# Patient Record
Sex: Female | Born: 2000 | Race: White | Hispanic: Yes | Marital: Single | Smoking: Never smoker
Health system: Southern US, Community
[De-identification: ages and names within clinical notes are randomized; demographics above are authoritative.]

## PROBLEM LIST (undated history)

## (undated) DIAGNOSIS — H539 Unspecified visual disturbance: Secondary | ICD-10-CM

## (undated) DIAGNOSIS — K760 Fatty (change of) liver, not elsewhere classified: Secondary | ICD-10-CM

## (undated) DIAGNOSIS — L853 Xerosis cutis: Secondary | ICD-10-CM

## (undated) DIAGNOSIS — G90A Postural orthostatic tachycardia syndrome (POTS): Secondary | ICD-10-CM

## (undated) DIAGNOSIS — F419 Anxiety disorder, unspecified: Secondary | ICD-10-CM

## (undated) DIAGNOSIS — F509 Eating disorder, unspecified: Secondary | ICD-10-CM

## (undated) DIAGNOSIS — Q796 Ehlers-Danlos syndrome, unspecified: Secondary | ICD-10-CM

## (undated) HISTORY — PX: TYMPANOSTOMY TUBE PLACEMENT: SHX32

## (undated) HISTORY — DX: Xerosis cutis: L85.3

## (undated) HISTORY — DX: Ehlers-Danlos syndrome, unspecified: Q79.60

## (undated) HISTORY — DX: Postural orthostatic tachycardia syndrome (POTS): G90.A

## (undated) HISTORY — PX: DENTAL SURGERY: SHX609

---
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
## 2022-01-02 ENCOUNTER — Other Ambulatory Visit: Payer: Self-pay | Admitting: *Deleted

## 2022-01-02 ENCOUNTER — Other Ambulatory Visit (HOSPITAL_COMMUNITY): Payer: Self-pay

## 2022-01-02 ENCOUNTER — Other Ambulatory Visit: Payer: Self-pay | Admitting: Pediatrics

## 2022-01-02 DIAGNOSIS — F411 Generalized anxiety disorder: Secondary | ICD-10-CM

## 2022-01-02 DIAGNOSIS — F902 Attention-deficit hyperactivity disorder, combined type: Secondary | ICD-10-CM

## 2022-01-02 DIAGNOSIS — F332 Major depressive disorder, recurrent severe without psychotic features: Secondary | ICD-10-CM

## 2022-01-02 DIAGNOSIS — G90A Postural orthostatic tachycardia syndrome (POTS): Secondary | ICD-10-CM

## 2022-01-02 DIAGNOSIS — G2581 Restless legs syndrome: Secondary | ICD-10-CM

## 2022-01-02 DIAGNOSIS — F5101 Primary insomnia: Secondary | ICD-10-CM

## 2022-01-02 DIAGNOSIS — R519 Headache, unspecified: Secondary | ICD-10-CM

## 2022-01-02 DIAGNOSIS — G8929 Other chronic pain: Secondary | ICD-10-CM

## 2022-01-02 DIAGNOSIS — G479 Sleep disorder, unspecified: Secondary | ICD-10-CM

## 2022-01-02 MED ORDER — FLUOXETINE HCL 40 MG PO CAPS: ORAL_CAPSULE | ORAL | 1 refills | Status: DC

## 2022-01-02 MED ORDER — HYDROXYZINE HCL 50 MG PO TABS: 50.0000 mg | ORAL_TABLET | Freq: Every day | ORAL | 1 refills | Status: DC

## 2022-01-02 MED ORDER — SODIUM CHLORIDE 1 G PO TABS: 1.0000 g | ORAL_TABLET | Freq: Three times a day (TID) | ORAL | 3 refills | Status: DC

## 2022-01-02 MED ORDER — METOPROLOL TARTRATE 25 MG PO TABS
12.5000 mg | ORAL_TABLET | Freq: Two times a day (BID) | ORAL | 3 refills | Status: DC
Start: 2022-01-02 — End: 2022-02-16

## 2022-01-02 MED ORDER — POLYSACCHARIDE-IRON COMPLEX 150 MG PO CAPS: 150.0000 mg | ORAL_CAPSULE | Freq: Every day | ORAL | 1 refills | Status: DC

## 2022-01-02 MED ORDER — MAGNESIUM OXIDE 400 MG PO CAPS: 400.0000 mg | ORAL_CAPSULE | Freq: Every day | ORAL | 1 refills | Status: DC

## 2022-01-02 MED ORDER — METHYLPHENIDATE HCL ER (CD) 40 MG PO CPCR: 40.0000 mg | ORAL_CAPSULE | ORAL | 0 refills | Status: DC

## 2022-01-02 MED ORDER — L-METHYLFOLATE-B6-B12 3-35-2 MG PO TABS: 1.0000 | ORAL_TABLET | Freq: Every day | ORAL | 3 refills | Status: DC

## 2022-01-02 MED ORDER — HYDROXYZINE HCL 10 MG PO TABS: 10.0000 mg | ORAL_TABLET | Freq: Three times a day (TID) | ORAL | 1 refills | Status: DC | PRN

## 2022-01-02 MED ORDER — LIDOCAINE 5 % EX PTCH: 1.0000 | MEDICATED_PATCH | CUTANEOUS | 0 refills | Status: DC

## 2022-01-03 ENCOUNTER — Ambulatory Visit: Payer: Medicaid Other | Admitting: Genetic Counselor

## 2022-01-04 ENCOUNTER — Other Ambulatory Visit: Payer: Self-pay | Admitting: Pediatrics

## 2022-01-04 ENCOUNTER — Other Ambulatory Visit (HOSPITAL_COMMUNITY): Payer: Self-pay

## 2022-01-04 MED ORDER — METHYLPHENIDATE HCL ER (LA) 40 MG PO CP24: 40.0000 mg | ORAL_CAPSULE | ORAL | 0 refills | Status: DC

## 2022-01-05 ENCOUNTER — Other Ambulatory Visit (HOSPITAL_COMMUNITY): Payer: Self-pay

## 2022-01-10 ENCOUNTER — Encounter: Payer: Medicaid Other | Admitting: Genetic Counselor

## 2022-01-23 NOTE — Progress Notes (Signed)
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   Personal Medical Information Alexandra [name] is an 21 year old Hispanic woman who reports that at age 88 her right knee popped out of its socket while she was sitting down. She was referred to an orthopedic who diagnosed her with loose joints. At age 34 she she began having of popping finger knuckles, shoulders and left hip. She was later found to have a bulging disc that contributed to severe back pain. Currently, in addition to the above issues, she reports lots of pain issues and a diagnosis of POTS. She tells me that she has not yet had cardiac imaging.  Family history Alexandra [name] is the only child of her parents. She is no longer in touch with her father, age 71 (II.2) and her other paternal relatives. Dad reportedly has mild hypermobility. No issues with hypermobility in other paternal or maternal relatives. Her mother is now 72 with hyperthyroidism and hypercholesterolemia.   Impression and Plan In summary, she reports history of joint laxity. I informed her that as there is no gene implicated in hEDS, genetic testing would not have any clinical utility. She understands this. Explained to her that she can get clinically evaluated to determine if she fulfills the criteria for hEDS. She verbalized understanding and seemed quite disappointed that genetic testing was not an option.   Sidney Ace, Ph.D, [facility] Clinical Molecular Geneticist

## 2022-02-02 ENCOUNTER — Other Ambulatory Visit: Payer: Self-pay | Admitting: Pediatrics

## 2022-02-02 DIAGNOSIS — K219 Gastro-esophageal reflux disease without esophagitis: Secondary | ICD-10-CM

## 2022-02-02 DIAGNOSIS — K581 Irritable bowel syndrome with constipation: Secondary | ICD-10-CM

## 2022-02-16 ENCOUNTER — Encounter: Payer: Self-pay | Admitting: Pediatrics

## 2022-02-16 ENCOUNTER — Ambulatory Visit (INDEPENDENT_AMBULATORY_CARE_PROVIDER_SITE_OTHER): Payer: Medicaid Other | Admitting: Pediatrics

## 2022-02-16 VITALS — BP 128/78 | HR 68 | Ht 63.0 in | Wt 240.6 lb

## 2022-02-16 DIAGNOSIS — Z3202 Encounter for pregnancy test, result negative: Secondary | ICD-10-CM | POA: Diagnosis not present

## 2022-02-16 DIAGNOSIS — Z113 Encounter for screening for infections with a predominantly sexual mode of transmission: Secondary | ICD-10-CM

## 2022-02-16 DIAGNOSIS — E782 Mixed hyperlipidemia: Secondary | ICD-10-CM | POA: Diagnosis not present

## 2022-02-16 DIAGNOSIS — R1084 Generalized abdominal pain: Secondary | ICD-10-CM

## 2022-02-16 DIAGNOSIS — R197 Diarrhea, unspecified: Secondary | ICD-10-CM

## 2022-02-16 DIAGNOSIS — G90A Postural orthostatic tachycardia syndrome (POTS): Secondary | ICD-10-CM

## 2022-02-16 DIAGNOSIS — K219 Gastro-esophageal reflux disease without esophagitis: Secondary | ICD-10-CM | POA: Diagnosis not present

## 2022-02-16 DIAGNOSIS — F411 Generalized anxiety disorder: Secondary | ICD-10-CM

## 2022-02-16 DIAGNOSIS — Z7251 High risk heterosexual behavior: Secondary | ICD-10-CM

## 2022-02-16 LAB — POCT URINE PREGNANCY: Preg Test, Ur: NEGATIVE

## 2022-02-16 MED ORDER — ULIPRISTAL ACETATE 30 MG PO TABS
30.0000 mg | ORAL_TABLET | Freq: Once | ORAL | Status: AC
  Administered 2022-02-16: 30 mg via ORAL

## 2022-02-16 NOTE — Patient Instructions (Signed)
Stop taking weight loss medication  Labs today  Plan B today  Use condoms always until 7 days after nexplanon insertion  We will bring you back next week for that

## 2022-02-16 NOTE — Progress Notes (Unsigned)
History was provided by the {relatives:[id number]}.  Alexandra [name] is a 21 y.o. female who is here for ***.  [facility]   HPI:  Pt reports she moved out of her mom's house about 2 weeks ago and is living with her boyfriend. She has been talking to him for a while before that and after she and mom got into an argument she left. He is in college and she is planning to go to college. Ever since she moved she hasn't taken her medications at all. She has been experiencing more nausea and abdominal pain. Mood has been better with move. Dizziness has been ok but not too bad. Fatigue some better.   Sexually active with female partner. Used condoms sometimes but stopped. Last had sex yesterday. She and her partner do not wish to be pregnant and she would like EC today and to start contraception. LMP was June 2-6. Would like to come back ASAP for nexplanon after Samson Frederic today. She does report that her boyfriend is on medications that make him unable to ejaculate.   Has been having belly pain that was particularly worse when she was stressed. She was taking lipozene weight loss medication which she only stopped yesterday. Incidentially, all . Did have nausea with some vomiting one night. Was getting some migraine on left side of head. Today feels better but has had some ongoing diarrhea that started 4-5 days ago. Started after upper GI sx. Denies fever. No known sick contacts.   No substance use. Boyfriend's dad does smoke around her sometimes. Boyfriend vapes but she doesn't.   No LMP recorded.  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 10/13/2021   Mixed hyperlipidemia 10/13/2021   PTSD (post-traumatic stress disorder) 09/21/2021   Transient alteration of awareness 09/21/2021   Sleep disturbance 08/10/2021   Low back pain 02/02/2021   POTS (postural orthostatic tachycardia syndrome) 01/20/2021   Bulging of lumbar  intervertebral disc 12/13/2020   Constipation 12/13/2020   Easy bruising 12/13/2020   Ehlers-Danlos syndrome 12/13/2020   Weight loss 11/16/2020   Chronic bilateral thoracic back pain 11/16/2020   Urinary incontinence without sensory awareness 11/16/2020   Hepatomegaly 06/16/2020   GAD (generalized anxiety disorder) 05/12/2020   Abnormal auditory perception of right ear 05/12/2020   Generalized abdominal pain 06/13/2018   Chronic fatigue 06/13/2018   Generalized hypermobility of joints 06/13/2018   Patellar subluxation 06/13/2018   Joint pain 06/13/2018   Self-injurious behavior 08/09/2017   Gastroesophageal reflux disease 08/09/2017   Attention deficit hyperactivity disorder (ADHD), combined type 08/09/2017   MDD (major depressive disorder), recurrent severe, without psychosis (HCC) 03/21/2017   Chronic nonintractable headache 01/22/2017   Insomnia 01/11/2017   Dizziness 01/04/2017   Suicidal ideation 12/04/2016   Anorexia nervosa with bulimia 11/13/2016    Current Outpatient Medications on File Prior to Visit  Medication Sig Dispense Refill   FLUoxetine (PROZAC) 40 MG capsule TAKE 1 CAPSULE(40 MG) BY MOUTH DAILY 90 capsule 1   hydrOXYzine (ATARAX) 10 MG tablet Take 1 tablet (10 mg total) by mouth 3 (three) times daily as needed. 180 tablet 1   hydrOXYzine (ATARAX) 50 MG tablet Take 1 tablet (50 mg total) by mouth at bedtime. 90 tablet 1   l-methylfolate-B6-B12 (METANX) 3-35-2 MG TABS tablet Take 1 tablet by mouth daily. 90 tablet 3   Magnesium Oxide 400 MG CAPS Take 1 capsule (400 mg total) by mouth at bedtime.  90 capsule 1   methylphenidate (RITALIN LA) 40 MG 24 hr capsule Take 1 capsule (40 mg total) by mouth every morning. 30 capsule 0   Polysaccharide-Iron Complex 150 MG CAPS Take 150 mg by mouth daily. 90 capsule 1   Prenatal Vit-Fe Fumarate-FA (PRENATAL VITAMIN PLUS LOW IRON) 27-1 MG TABS Take 1 tablet by mouth daily at 12 noon. 90 tablet 3   sodium chloride 1 g tablet  Take 1 tablet (1 g total) by mouth 3 (three) times daily. 90 tablet 3   topiramate (TOPAMAX) 25 MG tablet Take 1 tablet (25 mg total) by mouth at bedtime as needed. 30 tablet 3   topiramate (TOPAMAX) 25 MG tablet Take 1 tablet (25 mg total) by mouth at bedtime as needed. 30 tablet 3   Elastic Bandages & Supports (LUMBAR BACK BRACE/SUPPORT PAD) MISC 1 each by Does not apply route daily. SI joint belt (Patient not taking: Reported on 12/30/2021) 1 each 0   FLUoxetine (PROZAC) 20 MG capsule Take 20 mg and 40 mg capsule daily for daily total of 60 mg dose (Patient not taking: Reported on 02/16/2022) 90 capsule 1   lidocaine (LIDODERM) 5 % Place 1 patch onto the skin daily. Remove & Discard patch within 12 hours or as directed by MD (Patient not taking: Reported on 02/16/2022) 30 patch 0   metoprolol tartrate (LOPRESSOR) 25 MG tablet Take 0.5 tablets (12.5 mg total) by mouth 2 (two) times daily. (Patient not taking: Reported on 02/16/2022) 90 tablet 3   Misc. Devices (ADJUSTABLE ALUMINUM CANE) MISC Use 1 can as directed and instructed by physical therapist (Patient not taking: Reported on 12/30/2021) 1 each 0   No current facility-administered medications on file prior to visit.    Allergies  Allergen Reactions   Bee Pollen Other (See Comments)    "seasonal allergies"   Pollen Extract     "seasonal allergies"    Social History: Confidentiality was discussed with the patient and if applicable, with caregiver as well. Tobacco: *** Secondhand smoke exposure? {yes***/no:17258} Drugs/EtOH: *** Sexually active? {yes***/no:17258}  Safety: *** Last STI Screening:*** Pregnancy Prevention: ***  Physical Exam:    Vitals:   02/16/22 1506  BP: 128/78  Pulse: 68  Weight: 240 lb 9.6 oz (109.1 kg)  Height: 5\' 3"  (1.6 m)    Blood pressure %iles are not available for patients who are 18 years or older.  Physical Exam  Assessment/Plan: ***

## 2022-02-17 ENCOUNTER — Encounter: Payer: Self-pay | Admitting: Family

## 2022-02-17 LAB — COMPREHENSIVE METABOLIC PANEL
AG Ratio: 1.8 (calc) (ref 1.0–2.5)
ALT: 33 U/L — ABNORMAL HIGH (ref 5–32)
AST: 25 U/L (ref 12–32)
Albumin: 4.2 g/dL (ref 3.6–5.1)
Alkaline phosphatase (APISO): 60 U/L (ref 36–128)
BUN: 16 mg/dL (ref 7–20)
CO2: 24 mmol/L (ref 20–32)
Calcium: 9.4 mg/dL (ref 8.9–10.4)
Chloride: 105 mmol/L (ref 98–110)
Creat: 0.79 mg/dL (ref 0.50–0.96)
Globulin: 2.4 g/dL (calc) (ref 2.0–3.8)
Glucose, Bld: 88 mg/dL (ref 65–139)
Potassium: 4 mmol/L (ref 3.8–5.1)
Sodium: 139 mmol/L (ref 135–146)
Total Bilirubin: 0.5 mg/dL (ref 0.2–1.1)
Total Protein: 6.6 g/dL (ref 6.3–8.2)

## 2022-02-17 LAB — CBC WITH DIFFERENTIAL/PLATELET
Absolute Monocytes: 462 cells/uL (ref 200–900)
Basophils Absolute: 22 cells/uL (ref 0–200)
Basophils Relative: 0.4 %
Eosinophils Absolute: 160 cells/uL (ref 15–500)
Eosinophils Relative: 2.9 %
HCT: 37.4 % (ref 34.0–46.0)
Hemoglobin: 12 g/dL (ref 11.5–15.3)
Lymphs Abs: 2030 cells/uL (ref 1200–5200)
MCH: 25.8 pg (ref 25.0–35.0)
MCHC: 32.1 g/dL (ref 31.0–36.0)
MCV: 80.4 fL (ref 78.0–98.0)
MPV: 13 fL — ABNORMAL HIGH (ref 7.5–12.5)
Monocytes Relative: 8.4 %
Neutro Abs: 2827 cells/uL (ref 1800–8000)
Neutrophils Relative %: 51.4 %
Platelets: 224 10*3/uL (ref 140–400)
RBC: 4.65 10*6/uL (ref 3.80–5.10)
RDW: 13.4 % (ref 11.0–15.0)
Total Lymphocyte: 36.9 %
WBC: 5.5 10*3/uL (ref 4.5–13.0)

## 2022-02-17 LAB — LIPID PANEL
Cholesterol: 154 mg/dL (ref <170)
HDL: 35 mg/dL — ABNORMAL LOW (ref >45)
LDL Cholesterol (Calc): 89 mg/dL (calc) (ref <110)
Non-HDL Cholesterol (Calc): 119 mg/dL (calc) (ref <120)
Total CHOL/HDL Ratio: 4.4 (calc) (ref <5.0)
Triglycerides: 200 mg/dL — ABNORMAL HIGH (ref <90)

## 2022-02-17 LAB — LIPASE: Lipase: 40 U/L (ref 7–60)

## 2022-02-17 LAB — AMYLASE: Amylase: 33 U/L (ref 21–101)

## 2022-02-17 LAB — C. TRACHOMATIS/N. GONORRHOEAE RNA
C. trachomatis RNA, TMA: NOT DETECTED
N. gonorrhoeae RNA, TMA: NOT DETECTED

## 2022-02-17 LAB — TSH+FREE T4: TSH W/REFLEX TO FT4: 1.8 mIU/L

## 2022-02-21 ENCOUNTER — Encounter: Payer: Self-pay | Admitting: Family

## 2022-02-22 ENCOUNTER — Ambulatory Visit (INDEPENDENT_AMBULATORY_CARE_PROVIDER_SITE_OTHER): Payer: Medicaid Other | Admitting: Family

## 2022-02-22 ENCOUNTER — Encounter: Payer: Self-pay | Admitting: Family

## 2022-02-22 VITALS — BP 105/57 | HR 80 | Ht 63.39 in | Wt 240.2 lb

## 2022-02-22 DIAGNOSIS — Z3202 Encounter for pregnancy test, result negative: Secondary | ICD-10-CM | POA: Diagnosis not present

## 2022-02-22 DIAGNOSIS — Z30017 Encounter for initial prescription of implantable subdermal contraceptive: Secondary | ICD-10-CM | POA: Diagnosis not present

## 2022-02-22 LAB — POCT URINE PREGNANCY: Preg Test, Ur: NEGATIVE

## 2022-02-22 NOTE — Progress Notes (Signed)
History was provided by the patient.  Alexandra [name] is a 21 y.o. female who is here for nexplanon placement.   PCP confirmed? Yes.    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 10/13/2021   Mixed hyperlipidemia 10/13/2021   PTSD (post-traumatic stress disorder) 09/21/2021   Transient alteration of awareness 09/21/2021   Sleep disturbance 08/10/2021   Low back pain 02/02/2021   POTS (postural orthostatic tachycardia syndrome) 01/20/2021   Bulging of lumbar intervertebral disc 12/13/2020   Constipation 12/13/2020   Easy bruising 12/13/2020   Ehlers-Danlos syndrome 12/13/2020   Weight loss 11/16/2020   Chronic bilateral thoracic back pain 11/16/2020   Urinary incontinence without sensory awareness 11/16/2020   Hepatomegaly 06/16/2020   GAD (generalized anxiety disorder) 05/12/2020   Abnormal auditory perception of right ear 05/12/2020   Generalized abdominal pain 06/13/2018   Chronic fatigue 06/13/2018   Generalized hypermobility of joints 06/13/2018   Patellar subluxation 06/13/2018   Joint pain 06/13/2018   Self-injurious behavior 08/09/2017   Gastroesophageal reflux disease 08/09/2017   Attention deficit hyperactivity disorder (ADHD), combined type 08/09/2017   MDD (major depressive disorder), recurrent severe, without psychosis (HCC) 03/21/2017   Chronic nonintractable headache 01/22/2017   Insomnia 01/11/2017   Dizziness 01/04/2017   Suicidal ideation 12/04/2016   Anorexia nervosa with bulimia 11/13/2016    Current Outpatient Medications on File Prior to Visit  Medication Sig Dispense Refill   FLUoxetine (PROZAC) 40 MG capsule TAKE 1 CAPSULE(40 MG) BY MOUTH DAILY 90 capsule 1   hydrOXYzine (ATARAX) 10 MG tablet Take 1 tablet (10 mg total) by mouth 3 (three) times daily as needed. 180  tablet 1   hydrOXYzine (ATARAX) 50 MG tablet Take 1 tablet (50 mg total) by mouth at bedtime. 90 tablet 1   Magnesium Oxide 400 MG CAPS Take 1 capsule (400 mg total) by mouth at bedtime. 90 capsule 1   Polysaccharide-Iron Complex 150 MG CAPS Take 150 mg by mouth daily. 90 capsule 1   Prenatal Vit-Fe Fumarate-FA (PRENATAL VITAMIN PLUS LOW IRON) 27-1 MG TABS Take 1 tablet by mouth daily at 12 noon. 90 tablet 3   sodium chloride 1 g tablet Take 1 tablet (1 g total) by mouth 3 (three) times daily. 90 tablet 3   No current facility-administered medications on file prior to visit.    Allergies  Allergen Reactions   Bee Pollen Other (See Comments)    "seasonal allergies"   Pollen Extract     "seasonal allergies"    Physical Exam:    Vitals:   02/22/22 1120  BP: (!) 105/57  Pulse: 80  Weight: 240 lb 3.2 oz (109 kg)  Height: 5' 3.39" (1.61 m)    Blood pressure %iles are not available for patients who are 18 years or older. No LMP recorded.  Physical Exam Constitutional:      General: She is not in acute distress.    Appearance: She is well-developed.  HENT:     Head: Normocephalic and atraumatic.  Eyes:     General: No scleral icterus.    Pupils: Pupils are equal, round, and reactive to light.  Neck:     Thyroid: No thyromegaly.  Cardiovascular:     Rate and Rhythm: Normal rate and regular rhythm.     Heart sounds: Normal heart sounds.  No murmur heard. Pulmonary:     Effort: Pulmonary effort is normal.     Breath sounds: Normal breath sounds.  Abdominal:     Palpations: Abdomen is soft.  Musculoskeletal:        General: Normal range of motion.     Cervical back: Normal range of motion and neck supple.  Lymphadenopathy:     Cervical: No cervical adenopathy.  Skin:    General: Skin is warm and dry.     Findings: No rash.  Neurological:     Mental Status: She is alert and oriented to person, place, and time.     Cranial Nerves: No cranial nerve deficit.  Psychiatric:         Behavior: Behavior normal.        Thought Content: Thought content normal.        Judgment: Judgment normal.      Assessment/Plan: 1. Encounter for initial prescription of Nexplanon -see procedure note  -reviewed side effects, risks, benefits -abstain x 7 days before effective for birth control  -return in one month or sooner if needed  2. Pregnancy examination or test, negative result - POCT urine pregnancy

## 2022-02-22 NOTE — Patient Instructions (Signed)
Follow-up  in 1 month. Schedule this appointment before you leave clinic today.  Congratulations on getting your Nexplanon placement!  Below is some important information about Nexplanon.  First remember that Nexplanon does not prevent sexually transmitted infections.  Condoms will help prevent sexually transmitted infections. The Nexplanon starts working 7 days after it was inserted.  There is a risk of getting pregnant if you have unprotected sex in those first 7 days after placement of the Nexplanon.  The Nexplanon lasts for 3 years but can be removed at any time.  You can become pregnant as early as 1 week after removal.  You can have a new Nexplanon put in after the old one is removed if you like.  It is not known whether Nexplanon is as effective in women who are very overweight because the studies did not include many overweight women.  Nexplanon interacts with some medications, including barbiturates, bosentan, carbamazepine, felbamate, griseofulvin, oxcarbazepine, phenytoin, rifampin, St. John's wort, topiramate, HIV medicines.  Please alert your doctor if you are on any of these medicines.  Always tell other healthcare providers that you have a Nexplanon in your arm.  The Nexplanon was placed just under the skin.  Leave the outside bandage on for 24 hours.  Leave the smaller bandage on for 3-5 days or until it falls off on its own.  Keep the area clean and dry for 3-5 days. There is usually bruising or swelling at the insertion site for a few days to a week after placement.  If you see redness or pus draining from the insertion site, call us immediately.  Keep your user card with the date the implant was placed and the date the implant is to be removed.  The most common side effect is a change in your menstrual bleeding pattern.   This bleeding is generally not harmful to you but can be annoying.  Call or come in to see us if you have any concerns about the bleeding or if you have any  side effects or questions.    We will call you in 1 week to check in and we would like you to return to the clinic for a follow-up visit in 1 month.  You can call [facility] 24 hours a day with any questions or concerns.  There is always a nurse or doctor available to take your call.  Call 9-1-1 if you have a life-threatening emergency.  For anything else, please call us at [phone] before heading to the ER. 

## 2022-02-23 ENCOUNTER — Encounter: Payer: Self-pay | Admitting: Family

## 2022-02-26 NOTE — Progress Notes (Signed)
Cardiology Office Note:    Date:  03/10/2022   ID:  Alexandra [name], DOB 02-01-2001, MRN [id number]  PCP:  [facility]   [facility] Providers Cardiologist:  Meriam Sprague, MD {   Referring MD: [facility]     History of Present Illness:    Alexandra [name] is a 21 y.o. female with a hx of anxiety, depression and POTS who presents to clinic for follow-up.  She was initially seen in 08/2021 for evaluation for POTs. Orthostatics revealed postural tachycardia without drop in blood pressure. She was started on metop 12.5mg  BID.  Was seen by Eligha Bridegroom where she reported that she felt better on the metop but had difficulty getting her medications due to transportation issues. She was also having atypical chest pain at that time.  Today, the patient overall feels okay today. Continues to have palpitations especially with activity. Occasional lightheadedness which occurs with the rapid heart rates. HR seem to improve with walking or trying to exercise. Metoprolol helped the heart rates but she did have one episode of nausea/vomiting and lightheadedness and she since stopped. She is open to trying a different beta blocker.  Past Medical History:  Diagnosis Date   Anxiety    Dry skin    Eating disorder    Vision abnormalities     Past Surgical History:  Procedure Laterality Date   DENTAL SURGERY     TYMPANOSTOMY TUBE PLACEMENT      Current Medications: Current Meds  Medication Sig   hydrOXYzine (ATARAX) 10 MG tablet Take 1 tablet (10 mg total) by mouth 3 (three) times daily as needed.   hydrOXYzine (ATARAX) 50 MG tablet Take 1 tablet (50 mg total) by mouth at bedtime.   Prenatal Vit-Fe Fumarate-FA (PRENATAL VITAMIN PLUS LOW IRON) 27-1 MG TABS Take 1 tablet by mouth daily at 12 noon.   propranolol (INDERAL) 10 MG tablet Take 1 tablet (10 mg total) by mouth 2 (two) times daily.     Allergies:   Bee pollen and  Pollen extract   Social History   Socioeconomic History   Marital status: Single    Spouse name: Not on file   Number of children: Not on file   Years of education: Not on file   Highest education level: Not on file  Occupational History   Not on file  Tobacco Use   Smoking status: Never    Passive exposure: Yes   Smokeless tobacco: Never   Tobacco comments:    family smokes outside  Vaping Use   Vaping Use: Never used  Substance and Sexual Activity   Alcohol use: No   Drug use: No   Sexual activity: Never    Birth control/protection: Abstinence, Pill    Comment: pt. takes birth control pills for cycle regulation  Other Topics Concern   Not on file  Social History Narrative   [employer]th [employer] 22-23 school - lives with brother, sister, step father and mother.    Social Determinants of Health   Financial Resource Strain: Medium Risk (12/30/2021)   Overall Financial Resource Strain (CARDIA)    Difficulty of Paying Living Expenses: Somewhat hard  Food Insecurity: No Food Insecurity (12/30/2021)   Hunger Vital Sign    Worried About Running Out of Food in the Last Year: Never true    Ran Out of Food in the Last Year: Never true  Transportation Needs: Unmet Transportation Needs (03/06/2022)   PRAPARE -  Administrator, Civil Service (Medical): Yes    Lack of Transportation (Non-Medical): Yes  Physical Activity: Not on file  Stress: Not on file  Social Connections: Not on file     Family History: The patient's family history includes Asthma in her father; Cancer in her brother; Cataracts in her sister; Hodgkin's lymphoma in her brother; Strabismus in her sister.  ROS:   Please see the history of present illness.    Review of Systems  Constitutional:  Positive for malaise/fatigue.  Respiratory:  Positive for shortness of breath.   Cardiovascular:  Positive for chest pain and palpitations. Negative for orthopnea, claudication, leg swelling and PND.   Gastrointestinal:  Positive for abdominal pain.  Genitourinary:  Negative for hematuria.  Musculoskeletal:  Negative for falls.  Neurological:  Positive for dizziness. Negative for loss of consciousness.  Psychiatric/Behavioral:  Positive for depression. The patient is nervous/anxious.      EKGs/Labs/Other Studies Reviewed:    The following studies were reviewed today: No cardiac studies  EKG:  ECG today personally reviewed with NSR, HR 67  Recent Labs: 02/16/2022: ALT 33; BUN 16; Creat 0.79; Hemoglobin 12.0; Platelets 224; Potassium 4.0; Sodium 139  Recent Lipid Panel    Component Value Date/Time   CHOL 154 02/16/2022 1701   TRIG 200 (H) 02/16/2022 1701   HDL 35 (L) 02/16/2022 1701   CHOLHDL 4.4 02/16/2022 1701   VLDL 17 12/06/2017 0723   LDLCALC 89 02/16/2022 1701          Physical Exam:    VS:  BP 104/70   Pulse 67   Ht 5\' 3"  (1.6 m)   Wt 233 lb 3.2 oz (105.8 kg)   SpO2 98%   BMI 41.31 kg/m     Wt Readings from Last 3 Encounters:  03/10/22 233 lb 3.2 oz (105.8 kg) (>99 %, Z= 2.33)*  03/06/22 242 lb 9.6 oz (110 kg) (>99 %, Z= 2.41)*  02/22/22 240 lb 3.2 oz (109 kg) (>99 %, Z= 2.39)*   * Growth percentiles are based on CDC (Girls, 2-20 Years) data.     GEN:  Well nourished, well developed in no acute distress HEENT: Normal NECK: No JVD; No carotid bruits CARDIAC: RRR, no murmurs RESPIRATORY:  Clear to auscultation without rales, wheezing or rhonchi  ABDOMEN: Soft, non-tender, non-distended MUSCULOSKELETAL:  No edema; No deformity  SKIN: Warm and dry NEUROLOGIC:  Alert and oriented x 3 PSYCHIATRIC:  Normal affect   ASSESSMENT:    1. POTS (postural orthostatic tachycardia syndrome)   2. Ehlers-Danlos syndrome   3. 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 Deemed not to be a  candidate for genetic testing due to concern for hypermobile EDS. Did not tolerate metoprolol due to nausea but it did help with HR. Will trial propranolol and see if she tolerates.  -Did not tolerate metoprolol -Will do a low dose trial of propranolol 10mg  BID -Continue hydration -Continue salt tabs vs gatorade zero or poweraide zero -Compression socks -Graduated exercise  #Anxiety: #Depression: -Management per PCP      Medication Adjustments/Labs and Tests Ordered: Current medicines are reviewed at length with the patient today.  Concerns regarding medicines are outlined above.  Orders Placed This Encounter  Procedures   EKG 12-Lead   Meds ordered this encounter  Medications   propranolol (INDERAL)  10 MG tablet    Sig: Take 1 tablet (10 mg total) by mouth 2 (two) times daily.    Dispense:  60 tablet    Refill:  6    Patient Instructions  Medication Instructions:  Please discontinue Metoprolol. Take Propranolol 10 mg twice a day. Continue all other medications as listed.  *If you need a refill on your cardiac medications before your next appointment, please call your pharmacy*  Follow-Up: At [facility], you and your health needs are our priority.  As part of our continuing mission to provide you with exceptional heart care, we have created designated Provider Care Teams.  These Care Teams include your primary Cardiologist (physician) and Advanced Practice Providers (APPs -  Physician Assistants and Nurse Practitioners) who all work together to provide you with the care you need, when you need it.  We recommend signing up for the patient portal called "MyChart".  Sign up information is provided on this After Visit Summary.  MyChart is used to connect with patients for Virtual Visits (Telemedicine).  Patients are able to view lab/test results, encounter notes, upcoming appointments, etc.  Non-urgent messages can be sent to your provider as well.   To learn more about what  you can do with MyChart, go to [email or web address].    Your next appointment:   3 month(s)  The format for your next appointment:   In Person  Provider:   Eligha Bridegroom, NP     {   Important Information About Sugar          Signed, Meriam Sprague, MD  03/10/2022 12:54 PM    [facility]

## 2022-02-27 ENCOUNTER — Encounter: Payer: Self-pay | Admitting: Pediatrics

## 2022-02-28 ENCOUNTER — Encounter: Payer: Self-pay | Admitting: Family

## 2022-02-28 ENCOUNTER — Other Ambulatory Visit: Payer: Self-pay | Admitting: Family

## 2022-03-01 ENCOUNTER — Other Ambulatory Visit: Payer: Self-pay | Admitting: Family

## 2022-03-01 ENCOUNTER — Encounter: Payer: Self-pay | Admitting: Family

## 2022-03-01 ENCOUNTER — Other Ambulatory Visit: Payer: Self-pay | Admitting: Pediatrics

## 2022-03-01 DIAGNOSIS — Z30017 Encounter for initial prescription of implantable subdermal contraceptive: Secondary | ICD-10-CM | POA: Diagnosis not present

## 2022-03-01 MED ORDER — ETONOGESTREL 68 MG ~~LOC~~ IMPL
68.0000 mg | DRUG_IMPLANT | Freq: Once | SUBCUTANEOUS | Status: AC
  Administered 2022-03-01: 68 mg via SUBCUTANEOUS

## 2022-03-01 MED ORDER — FLUCONAZOLE 150 MG PO TABS: ORAL_TABLET | ORAL | 0 refills | Status: DC

## 2022-03-01 NOTE — Procedures (Signed)
Nexplanon Insertion  No contraindications for placement.  No liver disease, no unexplained vaginal bleeding, no h/o breast cancer, no h/o blood clots.  No LMP recorded.  UHCG: negative  Last Unprotected sex: a few days ago  Risks & benefits of Nexplanon discussed The nexplanon device was purchased and supplied by [facility]. Packaging instructions supplied to patient Consent form signed  The patient denies any allergies to anesthetics or antiseptics.  Procedure: Pt was placed in supine position. The left arm was flexed at the elbow and externally rotated so that left wrist was parallel to left ear The medial epicondyle of the left arm was identified The insertions site was marked 8 cm proximal to the medial epicondyle The insertion site was cleaned with Betadine The area surrounding the insertion site was covered with a sterile drape 1% lidocaine was injected just under the skin at the insertion site extending 4 cm proximally. The sterile preloaded disposable Nexaplanon applicator was removed from the sterile packaging The applicator needle was inserted at a 30 degree angle at 8 cm proximal to the medial epicondyle as marked The applicator was lowered to a horizontal position and advanced just under the skin for the full length of the needle The slider on the applicator was retracted fully while the applicator remained in the same position, then the applicator was removed. The implant was confirmed via palpation as being in position The implant position was demonstrated to the patient Pressure dressing was applied to the patient.  The patient was instructed to removed the pressure dressing in 24 hrs.  The patient was advised to move slowly from a supine to an upright position  The patient denied any concerns or complaints  The patient was instructed to schedule a follow-up appt in 1 month and to call sooner if any concerns.  The patient acknowledged agreement and understanding of  the plan.

## 2022-03-02 ENCOUNTER — Other Ambulatory Visit: Payer: Self-pay | Admitting: Family

## 2022-03-02 DIAGNOSIS — Z7187 Encounter for pediatric-to-adult transition counseling: Secondary | ICD-10-CM

## 2022-03-05 ENCOUNTER — Encounter: Payer: Self-pay | Admitting: Family

## 2022-03-06 ENCOUNTER — Ambulatory Visit (INDEPENDENT_AMBULATORY_CARE_PROVIDER_SITE_OTHER): Payer: Medicaid Other | Admitting: Pediatrics

## 2022-03-06 ENCOUNTER — Other Ambulatory Visit: Payer: Self-pay

## 2022-03-06 ENCOUNTER — Encounter: Payer: Self-pay | Admitting: Pediatrics

## 2022-03-06 VITALS — BP 114/68 | HR 74 | Ht 63.0 in | Wt 242.6 lb

## 2022-03-06 DIAGNOSIS — G90A Postural orthostatic tachycardia syndrome (POTS): Secondary | ICD-10-CM | POA: Diagnosis not present

## 2022-03-06 DIAGNOSIS — R519 Headache, unspecified: Secondary | ICD-10-CM

## 2022-03-06 DIAGNOSIS — B3731 Acute candidiasis of vulva and vagina: Secondary | ICD-10-CM

## 2022-03-06 DIAGNOSIS — G8929 Other chronic pain: Secondary | ICD-10-CM

## 2022-03-06 DIAGNOSIS — Z975 Presence of (intrauterine) contraceptive device: Secondary | ICD-10-CM | POA: Diagnosis not present

## 2022-03-06 MED ORDER — FLUCONAZOLE 150 MG PO TABS
ORAL_TABLET | ORAL | 0 refills | Status: DC
  Filled 2022-03-06: qty 2, 4d supply, fill #0

## 2022-03-06 NOTE — Patient Instructions (Signed)
Adult Primary Care Clinics Name Criteria Services   [facility]  Address: [address] [place], [place] [address]  Phone: [phone] Hours: Monday - Friday 9 AM -6 PM  Types of insurance accepted:  Commercial insurance [facility] (orange card) Medicaid Medicare Uninsured  Language services:  Video and phone interpreters available   Ages 18 and older    Adult primary care Onsite pharmacy Integrated behavioral health Financial assistance counseling Walk-in hours for established patients  Financial assistance counseling hours: Tuesdays 2:00PM - 5:00PM  Thursday 8:30AM - 4:30PM  Space is limited, 10 on Tuesday and 20 on Thursday. It's on first come first serve basis  Name Criteria Services   [facility]  Address: [address] [place], [place] [address]  Phone: [phone]  Hours: Monday - Friday 8:30 AM - 5 PM  Types of insurance accepted:  Commercial insurance Medicaid Medicare Uninsured  Language services:  Video and phone interpreters available   All ages - newborn to adult   Primary care for all ages (children and adults) Integrated behavioral health Nutritionist Financial assistance counseling   Name Criteria Services   [facility]  Located on the ground floor of [facility]  Address: [address]  [place],  [place]  [address]  Phone: [phone]  Hours: Monday - Friday 8:15 AM - 5 PM  Types of insurance accepted:  Commercial insurance Medicaid Medicare Uninsured  Language services:  Video and phone interpreters available   Ages 18 and older   Adult primary care Nutritionist Certified Diabetes Educator  Integrated behavioral health Financial assistance counseling   Name Criteria Services   [facility]  Address: [address] [place], [place] [address]  Phone:  [phone]  Hours: Monday - Friday 8:30 AM - 5 PM    Types of insurance accepted:  Commercial insurance Medicaid Medicare Uninsured  Language services:  Video and phone interpreters available   All ages - newborn to adult   Primary care for all ages (children and adults) Integrated behavioral health Financial assistance counseling    

## 2022-03-06 NOTE — Progress Notes (Signed)
History was provided by the patient.  Alexandra [name] is a 21 y.o. female who is here for depression, anxiety, multiple medical concerns, nexplanon f/u.  [facility]   HPI:  Pt reports she never got medication for yeast because it wasn't delivered and she had called but it didn't  come.   She is still getting bad migraines which cause her a lot of problems. Happening a few times a week. Tylenol seems to help but doesn't always go away, but does tend to stop on it's own after that.   Denies additional vomiting.   Otherwise has not restarted any of her other medications.   She is concerned how much she has been gaining weight. She has been eating more since going to her boyfriend's house. Usually eats at least 3 times a day. Drinking water, juice, sometimes soda.   No LMP recorded.  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 10/13/2021   Mixed hyperlipidemia 10/13/2021   PTSD (post-traumatic stress disorder) 09/21/2021   Transient alteration of awareness 09/21/2021   Sleep disturbance 08/10/2021   Low back pain 02/02/2021   POTS (postural orthostatic tachycardia syndrome) 01/20/2021   Bulging of lumbar intervertebral disc 12/13/2020   Constipation 12/13/2020   Easy bruising 12/13/2020   Ehlers-Danlos syndrome 12/13/2020   Weight loss 11/16/2020   Chronic bilateral thoracic back pain 11/16/2020   Urinary incontinence without sensory awareness 11/16/2020   Hepatomegaly 06/16/2020   GAD (generalized anxiety disorder) 05/12/2020   Abnormal auditory perception of right ear 05/12/2020   Generalized abdominal pain 06/13/2018   Chronic fatigue 06/13/2018   Generalized hypermobility of joints 06/13/2018   Patellar subluxation 06/13/2018   Joint pain 06/13/2018   Self-injurious behavior 08/09/2017   Gastroesophageal reflux disease 08/09/2017   Attention deficit hyperactivity disorder (ADHD), combined  type 08/09/2017   MDD (major depressive disorder), recurrent severe, without psychosis (HCC) 03/21/2017   Chronic nonintractable headache 01/22/2017   Insomnia 01/11/2017   Dizziness 01/04/2017   Suicidal ideation 12/04/2016   Anorexia nervosa with bulimia 11/13/2016    Current Outpatient Medications on File Prior to Visit  Medication Sig Dispense Refill   FLUoxetine (PROZAC) 40 MG capsule TAKE 1 CAPSULE(40 MG) BY MOUTH DAILY 90 capsule 1   hydrOXYzine (ATARAX) 10 MG tablet Take 1 tablet (10 mg total) by mouth 3 (three) times daily as needed. 180 tablet 1   hydrOXYzine (ATARAX) 50 MG tablet Take 1 tablet (50 mg total) by mouth at bedtime. 90 tablet 1   Magnesium Oxide 400 MG CAPS Take 1 capsule (400 mg total) by mouth at bedtime. 90 capsule 1   Polysaccharide-Iron Complex 150 MG CAPS Take 150 mg by mouth daily. 90 capsule 1   Prenatal Vit-Fe Fumarate-FA (PRENATAL VITAMIN PLUS LOW IRON) 27-1 MG TABS Take 1 tablet by mouth daily at 12 noon. 90 tablet 3   sodium chloride 1 g tablet Take 1 tablet (1 g total) by mouth 3 (three) times daily. 90 tablet 3   No current facility-administered medications on file prior to visit.    Allergies  Allergen Reactions   Bee Pollen Other (See Comments)    "seasonal allergies"   Pollen Extract     "seasonal allergies"    Physical Exam:    Vitals:   03/06/22 1118  BP: 114/68  Pulse: 74  Weight: 242 lb 9.6 oz (110 kg)  Height: 5\' 3"  (1.6 m)    Blood  pressure %iles are not available for patients who are 18 years or older.  Physical Exam Vitals and nursing note reviewed.  Constitutional:      General: She is not in acute distress.    Appearance: She is well-developed.  Neck:     Thyroid: No thyromegaly.  Cardiovascular:     Rate and Rhythm: Normal rate and regular rhythm.     Heart sounds: No murmur heard. Pulmonary:     Breath sounds: Normal breath sounds.  Abdominal:     Palpations: Abdomen is soft. There is no mass.     Tenderness:  There is no abdominal tenderness. There is no guarding.  Musculoskeletal:     Right lower leg: No edema.     Left lower leg: No edema.  Lymphadenopathy:     Cervical: No cervical adenopathy.  Skin:    General: Skin is warm.     Findings: No rash.     Comments: Nexplanon well healed in LUE  Neurological:     Mental Status: She is alert.     Comments: No tremor     Assessment/Plan: 1. POTS (postural orthostatic tachycardia syndrome) Established with cardiology. Not currently taking meds as prescribed. Has f/u scheduled.   2. Nexplanon in place Well healed today without concerns.   3. Vaginal yeast infection Will send diflucan downstairs to pharmacy for pick up today.  - fluconazole (DIFLUCAN) 150 MG tablet; Take 1 tablet today and 1 tablet 3 days from now  Dispense: 2 tablet; Refill: 0  4. Chronic nonintractable headache, unspecified headache type Discussed getting established with adult provider for multiple ongoing medical concerns. All medicaid providers list given and instructed her to call for appt. She was agreeable.   Return as needed prior to establishing with adult care.   Alfonso Ramus, FNP

## 2022-03-10 ENCOUNTER — Encounter: Payer: Self-pay | Admitting: Cardiology

## 2022-03-10 ENCOUNTER — Ambulatory Visit (INDEPENDENT_AMBULATORY_CARE_PROVIDER_SITE_OTHER): Payer: Medicaid Other | Admitting: Cardiology

## 2022-03-10 VITALS — BP 104/70 | HR 67 | Ht 63.0 in | Wt 233.2 lb

## 2022-03-10 DIAGNOSIS — F419 Anxiety disorder, unspecified: Secondary | ICD-10-CM | POA: Diagnosis not present

## 2022-03-10 DIAGNOSIS — G90A Postural orthostatic tachycardia syndrome (POTS): Secondary | ICD-10-CM

## 2022-03-10 DIAGNOSIS — Q796 Ehlers-Danlos syndrome, unspecified: Secondary | ICD-10-CM | POA: Diagnosis not present

## 2022-03-10 MED ORDER — PROPRANOLOL HCL 10 MG PO TABS
10.0000 mg | ORAL_TABLET | Freq: Two times a day (BID) | ORAL | 6 refills | Status: DC
Start: 2022-03-10 — End: 2022-05-17

## 2022-03-10 NOTE — Patient Instructions (Signed)
Medication Instructions:  Please discontinue Metoprolol. Take Propranolol 10 mg twice a day. Continue all other medications as listed.  *If you need a refill on your cardiac medications before your next appointment, please call your pharmacy*  Follow-Up: At [facility], you and your health needs are our priority.  As part of our continuing mission to provide you with exceptional heart care, we have created designated Provider Care Teams.  These Care Teams include your primary Cardiologist (physician) and Advanced Practice Providers (APPs -  Physician Assistants and Nurse Practitioners) who all work together to provide you with the care you need, when you need it.  We recommend signing up for the patient portal called "MyChart".  Sign up information is provided on this After Visit Summary.  MyChart is used to connect with patients for Virtual Visits (Telemedicine).  Patients are able to view lab/test results, encounter notes, upcoming appointments, etc.  Non-urgent messages can be sent to your provider as well.   To learn more about what you can do with MyChart, go to [email or web address].    Your next appointment:   3 month(s)  The format for your next appointment:   In Person  Provider:   Eligha Bridegroom, NP     {   Important Information About Sugar

## 2022-03-20 ENCOUNTER — Encounter: Payer: Self-pay | Admitting: Family

## 2022-03-24 ENCOUNTER — Encounter: Payer: Self-pay | Admitting: Family

## 2022-03-30 ENCOUNTER — Encounter: Payer: Self-pay | Admitting: Family

## 2022-04-03 ENCOUNTER — Encounter: Payer: Self-pay | Admitting: Family

## 2022-04-03 ENCOUNTER — Other Ambulatory Visit: Payer: Self-pay | Admitting: Pediatrics

## 2022-04-03 MED ORDER — ONDANSETRON 8 MG PO TBDP: 8.0000 mg | ORAL_TABLET | Freq: Three times a day (TID) | ORAL | 0 refills | Status: DC | PRN

## 2022-04-10 ENCOUNTER — Encounter: Payer: Medicaid Other | Admitting: Physical Medicine and Rehabilitation

## 2022-04-14 ENCOUNTER — Encounter (HOSPITAL_COMMUNITY): Payer: Self-pay

## 2022-04-14 ENCOUNTER — Emergency Department (HOSPITAL_COMMUNITY): Payer: Medicaid Other

## 2022-04-14 ENCOUNTER — Emergency Department (HOSPITAL_COMMUNITY)
Admission: EM | Admit: 2022-04-14 | Discharge: 2022-04-14 | Disposition: A | Discharge status: Home / Self Care | Payer: Medicaid Other | Attending: Emergency Medicine | Admitting: Emergency Medicine

## 2022-04-14 ENCOUNTER — Encounter: Payer: Self-pay | Admitting: Family

## 2022-04-14 ENCOUNTER — Other Ambulatory Visit: Payer: Self-pay

## 2022-04-14 ENCOUNTER — Ambulatory Visit
Admission: EM | Admit: 2022-04-14 | Discharge: 2022-04-14 | Disposition: A | Discharge status: Home / Self Care | Payer: Medicaid Other | Attending: Internal Medicine | Admitting: Internal Medicine

## 2022-04-14 DIAGNOSIS — R109 Unspecified abdominal pain: Secondary | ICD-10-CM | POA: Insufficient documentation

## 2022-04-14 DIAGNOSIS — R112 Nausea with vomiting, unspecified: Secondary | ICD-10-CM | POA: Insufficient documentation

## 2022-04-14 DIAGNOSIS — R1084 Generalized abdominal pain: Secondary | ICD-10-CM

## 2022-04-14 DIAGNOSIS — R197 Diarrhea, unspecified: Secondary | ICD-10-CM

## 2022-04-14 DIAGNOSIS — R42 Dizziness and giddiness: Secondary | ICD-10-CM

## 2022-04-14 DIAGNOSIS — I880 Nonspecific mesenteric lymphadenitis: Secondary | ICD-10-CM

## 2022-04-14 DIAGNOSIS — R110 Nausea: Secondary | ICD-10-CM

## 2022-04-14 HISTORY — DX: Fatty (change of) liver, not elsewhere classified: K76.0

## 2022-04-14 LAB — CBC
HCT: 42 % (ref 36.0–46.0)
Hemoglobin: 13.6 g/dL (ref 12.0–15.0)
MCH: 25.6 pg — ABNORMAL LOW (ref 26.0–34.0)
MCHC: 32.4 g/dL (ref 30.0–36.0)
MCV: 78.9 fL — ABNORMAL LOW (ref 80.0–100.0)
Platelets: 194 10*3/uL (ref 150–400)
RBC: 5.32 MIL/uL — ABNORMAL HIGH (ref 3.87–5.11)
RDW: 13.8 % (ref 11.5–15.5)
WBC: 4.4 10*3/uL (ref 4.0–10.5)
nRBC: 0 % (ref 0.0–0.2)

## 2022-04-14 LAB — URINALYSIS, ROUTINE W REFLEX MICROSCOPIC
Bacteria, UA: NONE SEEN
Bilirubin Urine: NEGATIVE
Glucose, UA: NEGATIVE mg/dL
Ketones, ur: NEGATIVE mg/dL
Nitrite: NEGATIVE
Protein, ur: NEGATIVE mg/dL
Specific Gravity, Urine: 1.015 (ref 1.005–1.030)
pH: 6 (ref 5.0–8.0)

## 2022-04-14 LAB — COMPREHENSIVE METABOLIC PANEL
ALT: 41 U/L (ref 0–44)
AST: 29 U/L (ref 15–41)
Albumin: 4.1 g/dL (ref 3.5–5.0)
Alkaline Phosphatase: 66 U/L (ref 38–126)
Anion gap: 5 (ref 5–15)
BUN: 9 mg/dL (ref 6–20)
CO2: 20 mmol/L — ABNORMAL LOW (ref 22–32)
Calcium: 9.3 mg/dL (ref 8.9–10.3)
Chloride: 112 mmol/L — ABNORMAL HIGH (ref 98–111)
Creatinine, Ser: 0.87 mg/dL (ref 0.44–1.00)
GFR, Estimated: >60 mL/min (ref >60)
Glucose, Bld: 101 mg/dL — ABNORMAL HIGH (ref 70–99)
Potassium: 4.1 mmol/L (ref 3.5–5.1)
Sodium: 137 mmol/L (ref 135–145)
Total Bilirubin: 0.6 mg/dL (ref 0.3–1.2)
Total Protein: 7.3 g/dL (ref 6.5–8.1)

## 2022-04-14 LAB — RAPID URINE DRUG SCREEN, HOSP PERFORMED
Amphetamines: NOT DETECTED
Barbiturates: NOT DETECTED
Benzodiazepines: NOT DETECTED
Cocaine: NOT DETECTED
Opiates: NOT DETECTED
Tetrahydrocannabinol: NOT DETECTED

## 2022-04-14 LAB — I-STAT BETA HCG BLOOD, ED (MC, WL, AP ONLY): I-stat hCG, quantitative: <5 m[IU]/mL (ref <5)

## 2022-04-14 LAB — LIPASE, BLOOD: Lipase: 46 U/L (ref 11–51)

## 2022-04-14 LAB — POCT URINE PREGNANCY: Preg Test, Ur: NEGATIVE

## 2022-04-14 MED ORDER — IOHEXOL 300 MG/ML  SOLN
100.0000 mL | Freq: Once | INTRAMUSCULAR | Status: AC | PRN
  Administered 2022-04-14: 100 mL via INTRAVENOUS

## 2022-04-14 MED ORDER — ONDANSETRON 4 MG PO TBDP
4.0000 mg | ORAL_TABLET | Freq: Once | ORAL | Status: AC | PRN
  Administered 2022-04-14: 4 mg via ORAL
  Filled 2022-04-14: qty 1

## 2022-04-14 MED ORDER — ONDANSETRON HCL 4 MG/2ML IJ SOLN
4.0000 mg | Freq: Once | INTRAMUSCULAR | Status: AC
  Administered 2022-04-14: 4 mg via INTRAVENOUS
  Filled 2022-04-14: qty 2

## 2022-04-14 MED ORDER — TRAMADOL HCL 50 MG PO TABS: 50.0000 mg | ORAL_TABLET | Freq: Four times a day (QID) | ORAL | 0 refills | Status: DC | PRN

## 2022-04-14 MED ORDER — ONDANSETRON 8 MG PO TBDP: 8.0000 mg | ORAL_TABLET | Freq: Three times a day (TID) | ORAL | 0 refills | Status: DC | PRN

## 2022-04-14 MED ORDER — SODIUM CHLORIDE 0.9 % IV BOLUS
1000.0000 mL | Freq: Once | INTRAVENOUS | Status: AC
  Administered 2022-04-14: 1000 mL via INTRAVENOUS

## 2022-04-14 MED ORDER — HYDROMORPHONE HCL 1 MG/ML IJ SOLN
0.5000 mg | Freq: Once | INTRAMUSCULAR | Status: AC
  Administered 2022-04-14: 0.5 mg via INTRAVENOUS
  Filled 2022-04-14: qty 1

## 2022-04-14 NOTE — Discharge Instructions (Signed)
Pregnancy test was negative.  Please go to the emergency department as soon as you leave urgent care for further evaluation and management. 

## 2022-04-14 NOTE — ED Triage Notes (Signed)
Pt c/o nausea, abd pain, onset ~2-3 weeks

## 2022-04-14 NOTE — ED Provider Notes (Signed)
[facility]    CSN: [id number] Arrival date & time: 04/14/22  1228      History   Chief Complaint Chief Complaint  Patient presents with   Abdominal Pain    HPI Alexandra [name] is a 21 y.o. female.   Patient presents with intermittent and persistent abdominal pain that has been present for 2 to 3 weeks.  Patient rates pain at 6/10 on pain scale and describes it as a cramping/stabbing pain.  Pain is located mainly in the lower abdomen at right lower quadrant but is sometimes located under bilateral rib cage.  Also having nausea, vomiting, diarrhea.  Denies blood in stool or emesis.  Patient does report history of GERD and IBS but reports that these symptoms "feel different".  Patient is not sure how many times she has vomited or had diarrhea on a daily basis since symptoms started.  Denies any associated urinary symptoms, back pain, fever.  Denies any known sick contacts.  Patient reports that she has been drinking lots of fluids.  She does report some dizziness as well.  Patient states that last menstrual cycle completed a few days prior, but she reports that her menstrual cycles have been irregular since getting on birth control recently.   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 10/13/2021   Mixed hyperlipidemia 10/13/2021   PTSD (post-traumatic stress disorder) 09/21/2021   Transient alteration of awareness 09/21/2021   Sleep disturbance 08/10/2021   Low back pain 02/02/2021   POTS (postural orthostatic tachycardia syndrome) 01/20/2021   Bulging of lumbar intervertebral disc 12/13/2020   Constipation 12/13/2020   Easy bruising 12/13/2020   Ehlers-Danlos syndrome 12/13/2020   Weight loss 11/16/2020   Chronic bilateral thoracic back pain 11/16/2020    Urinary incontinence without sensory awareness 11/16/2020   Hepatomegaly 06/16/2020   GAD (generalized anxiety disorder) 05/12/2020   Abnormal auditory perception of right ear 05/12/2020   Generalized abdominal pain 06/13/2018   Chronic fatigue 06/13/2018   Generalized hypermobility of joints 06/13/2018   Patellar subluxation 06/13/2018   Joint pain 06/13/2018   Self-injurious behavior 08/09/2017   Gastroesophageal reflux disease 08/09/2017   Attention deficit hyperactivity disorder (ADHD), combined type 08/09/2017   MDD (major depressive disorder), recurrent severe, 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      Home Medications    Prior to Admission medications   Medication Sig Start Date End Date Taking? Authorizing Provider  ondansetron (ZOFRAN-ODT) 8 MG disintegrating tablet Take 1 tablet (8 mg total) by mouth every 8 (eight) hours as needed for nausea or vomiting. 04/03/22   Verneda Skill, FNP  fluconazole (DIFLUCAN) 150 MG tablet Take 1 tablet today and 1 tablet 3 days from now Patient not taking: Reported on 03/10/2022 03/06/22   Verneda Skill, FNP  hydrOXYzine (ATARAX) 10 MG tablet Take 1 tablet (10 mg total) by mouth 3 (three) times daily  as needed. 01/02/22   Trude Mcburney, FNP  hydrOXYzine (ATARAX) 50 MG tablet Take 1 tablet (50 mg total) by mouth at bedtime. 01/02/22   Trude Mcburney, FNP  Prenatal Vit-Fe Fumarate-FA (PRENATAL VITAMIN PLUS LOW IRON) 27-1 MG TABS Take 1 tablet by mouth daily at 12 noon. 10/13/21   Trude Mcburney, FNP  propranolol (INDERAL) 10 MG tablet Take 1 tablet (10 mg total) by mouth 2 (two) times daily. 03/10/22   Freada Bergeron, MD    Family History Family History  Problem Relation Age of Onset   Asthma Father    Cataracts Sister    Strabismus Sister    Hodgkin's lymphoma Brother    Cancer Brother     Social History Social History   Tobacco Use   Smoking status: Never    Passive exposure: Yes   Smokeless tobacco: Never   Tobacco comments:    family smokes outside  Vaping Use   Vaping Use: Never used  Substance Use Topics   Alcohol use: No   Drug use: No     Allergies   Bee pollen and Pollen extract   Review of Systems Review of Systems Per HPI  Physical Exam Triage Vital Signs ED Triage Vitals [04/14/22 1235]  Enc Vitals Group     BP 100/70     Pulse Rate 78     Resp 18     Temp 98 F (36.7 C)     Temp Source Oral     SpO2 98 %     Weight      Height      Head Circumference      Peak Flow      Pain Score 0     Pain Loc      Pain Edu?      Excl. in [place]?    No data found.  Updated Vital Signs BP 100/70 (BP Location: Left Arm)   Pulse 78   Temp 98 F (36.7 C) (Oral)   Resp 18   SpO2 98%   Visual Acuity Right Eye Distance:   Left Eye Distance:   Bilateral Distance:    Right Eye Near:   Left Eye Near:    Bilateral Near:     Physical Exam Constitutional:      General: She is not in acute distress.    Appearance: Normal appearance. She is not toxic-appearing or diaphoretic.  HENT:     Head: Normocephalic and atraumatic.  Eyes:     Extraocular Movements: Extraocular movements intact.     Conjunctiva/sclera: Conjunctivae normal.  Cardiovascular:     Rate and Rhythm: Normal rate and regular rhythm.     Pulses: Normal pulses.     Heart sounds: Normal heart sounds.  Pulmonary:     Effort: Pulmonary effort is normal. No respiratory distress.     Breath sounds: Normal breath sounds.  Abdominal:     General: Bowel sounds are normal. There is no distension.     Palpations: Abdomen is soft.     Tenderness: There is abdominal tenderness in the right  lower quadrant and left upper quadrant.  Neurological:     General: No focal deficit present.     Mental Status: She is alert and oriented to person, place, and time. Mental status is at baseline.  Psychiatric:        Mood and Affect: Mood normal.        Behavior: Behavior normal.  Thought Content: Thought content normal.        Judgment: Judgment normal.      UC Treatments / Results  Labs (all labs ordered are listed, but only abnormal results are displayed) Labs Reviewed  POCT URINE PREGNANCY    EKG   Radiology No results found.  Procedures Procedures (including critical care time)  Medications Ordered in UC Medications - No data to display  Initial Impression / Assessment and Plan / UC Course  I have reviewed the triage vital signs and the nursing notes.  Pertinent labs & imaging results that were available during my care of the patient were reviewed by me and considered in my medical decision making (see chart for details).     Pregnancy test was negative.  Given concern for dehydration with associated dizziness and possible need for advanced imaging of the abdomen which cannot be provided here in urgent care, I do think patient needs further evaluation and management at the hospital.  Patient was advised to go to the ER for further evaluation and management.  Patient was agreeable with plan.  Vital signs stable at discharge.  Agree with patient self transfer to the hospital. Final Clinical Impressions(s) / UC Diagnoses   Final diagnoses:  Generalized abdominal pain  Nausea vomiting and diarrhea  Dizziness and giddiness     Discharge Instructions      Pregnancy test was negative.  Please go to the emergency department as soon as you leave urgent care for further evaluation and management.     ED Prescriptions   None    PDMP not reviewed this encounter.   Gustavus Bryant, [place] 04/14/22 1339

## 2022-04-14 NOTE — ED Provider Triage Note (Signed)
Emergency Medicine Provider Triage Evaluation Note  Alexandra [name] , a 21 y.o. female  was evaluated in triage.  Pt complains of abdominal pain.  Patient states that symptoms have been present for approximately the past 2 to 3 weeks.  She denies worsening of symptoms with eating as well as movement.  She states pain feels like knives are stabbing.  She reports pain being all over her abdomen.  She secondarily endorses persistent nausea as well as some vomiting over the past 2 weeks.  She also notes some black stools but denies hematochezia.  Denies urinary/vaginal symptoms, fever, chills, night sweats, chest pain, shortness of breath.  Review of Systems  Positive: See above Negative:   Physical Exam  BP 130/83 (BP Location: Right Arm)   Pulse 70   Temp 98 F (36.7 C) (Oral)   Resp 16   Ht 5\' 4"  (1.626 m)   Wt 105.2 kg   LMP 04/13/2022 (Approximate)   SpO2 100%   BMI 39.82 kg/m  Gen:   Awake, no distress   Resp:  Normal effort  MSK:   Moves extremities without difficulty  Other:  Mild epigastric and left upper quadrant tenderness.  Mild left-sided CVA tenderness.  Medical Decision Making  Medically screening exam initiated at 3:20 PM.  Appropriate orders placed.  06/13/2022 was informed that the remainder of the evaluation will be completed by another provider, this initial triage assessment does not replace that evaluation, and the importance of remaining in the ED until their evaluation is complete.     [name], Peter Garter 04/14/22 [phone]

## 2022-04-14 NOTE — ED Notes (Signed)
CT needs IV for contrast study

## 2022-04-14 NOTE — ED Notes (Signed)
Provided pt with water and graham crackers for PO challenge

## 2022-04-14 NOTE — ED Provider Notes (Signed)
[facility]-EMERGENCY DEPT Provider Note   CSN: [id number] Arrival date & time: 04/14/22  1404     History  Chief Complaint  Patient presents with   Abdominal Pain   Emesis    Alexandra [name] is a 21 y.o. female.  Patient c/o mid to diffuse abd pain in the past 2-3 weeks. Symptoms at rest, constant, dull, without specific exacerbating or alleviating factors. Not consistently related to eating or certain food. Is having normal bms. No dysuria or gu c/o. No vaginal discharge or bleeding. Hx IBS but not sure if similar. No hx pud or gallstones. No back or flank pain. No chest pain or sob. +nausea and a couple episodes emesis - no bloody or bilious emesis.   The history is provided by the patient and medical records.  Abdominal Pain Associated symptoms: nausea and vomiting   Associated symptoms: no chest pain, no cough, no dysuria, no fever, no shortness of breath, no sore throat, no vaginal bleeding and no vaginal discharge   Emesis Associated symptoms: abdominal pain   Associated symptoms: no cough, no fever, no headaches and no sore throat        Home Medications Prior to Admission medications   Medication Sig Start Date End Date Taking? Authorizing Provider  ondansetron (ZOFRAN-ODT) 8 MG disintegrating tablet Take 1 tablet (8 mg total) by mouth every 8 (eight) hours as needed for nausea or vomiting. 04/03/22   Verneda Skill, FNP  fluconazole (DIFLUCAN) 150 MG tablet Take 1 tablet today and 1 tablet 3 days from now Patient not taking: Reported on 03/10/2022 03/06/22   Verneda Skill, FNP  hydrOXYzine (ATARAX) 10 MG tablet Take 1 tablet (10 mg total) by mouth 3 (three) times daily as needed. 01/02/22   Verneda Skill, FNP  hydrOXYzine (ATARAX) 50 MG tablet Take 1 tablet (50 mg total) by mouth at bedtime. 01/02/22   Verneda Skill, FNP  Prenatal Vit-Fe Fumarate-FA (PRENATAL VITAMIN PLUS LOW IRON) 27-1 MG TABS Take 1 tablet by mouth daily at 12  noon. 10/13/21   Verneda Skill, FNP  propranolol (INDERAL) 10 MG tablet Take 1 tablet (10 mg total) by mouth 2 (two) times daily. 03/10/22   Meriam Sprague, MD      Allergies    Bee pollen and Pollen extract    Review of Systems   Review of Systems  Constitutional:  Negative for fever.  HENT:  Negative for sore throat.   Eyes:  Negative for redness.  Respiratory:  Negative for cough and shortness of breath.   Cardiovascular:  Negative for chest pain.  Gastrointestinal:  Positive for abdominal pain, nausea and vomiting.  Genitourinary:  Negative for dysuria, flank pain, vaginal bleeding and vaginal discharge.  Musculoskeletal:  Negative for back pain and neck pain.  Skin:  Negative for rash.  Neurological:  Negative for headaches.  Hematological:  Does not bruise/bleed easily.  Psychiatric/Behavioral:  Negative for confusion.     Physical Exam Updated Vital Signs BP 116/64   Pulse 66   Temp 98.2 F (36.8 C) (Oral)   Resp 16   Ht 1.626 m (5\' 4" )   Wt 105.2 kg   LMP 04/13/2022 (Approximate)   SpO2 100%   BMI 39.82 kg/m  Physical Exam Vitals and nursing note reviewed.  Constitutional:      Appearance: Normal appearance. She is well-developed.  HENT:     Head: Atraumatic.     Nose: Nose normal.  Mouth/Throat:     Mouth: Mucous membranes are moist.  Eyes:     General: No scleral icterus.    Conjunctiva/sclera: Conjunctivae normal.  Neck:     Trachea: No tracheal deviation.  Cardiovascular:     Rate and Rhythm: Normal rate and regular rhythm.     Pulses: Normal pulses.     Heart sounds: Normal heart sounds. No murmur heard.    No friction rub. No gallop.  Pulmonary:     Effort: Pulmonary effort is normal. No respiratory distress.     Breath sounds: Normal breath sounds.  Abdominal:     General: Bowel sounds are normal. There is no distension.     Palpations: Abdomen is soft.     Tenderness: There is no abdominal tenderness. There is no guarding.   Genitourinary:    Comments: No cva tenderness.  Musculoskeletal:        General: No swelling.     Cervical back: Normal range of motion and neck supple. No rigidity. No muscular tenderness.  Skin:    General: Skin is warm and dry.     Findings: No rash.  Neurological:     Mental Status: She is alert.     Comments: Alert, speech normal.   Psychiatric:        Mood and Affect: Mood normal.     ED Results / Procedures / Treatments   Labs (all labs ordered are listed, but only abnormal results are displayed) Results for orders placed or performed during the hospital encounter of 04/14/22  Lipase, blood  Result Value Ref Range   Lipase 46 11 - 51 U/L  Comprehensive metabolic panel  Result Value Ref Range   Sodium 137 135 - 145 mmol/L   Potassium 4.1 3.5 - 5.1 mmol/L   Chloride 112 (H) 98 - 111 mmol/L   CO2 20 (L) 22 - 32 mmol/L   Glucose, Bld 101 (H) 70 - 99 mg/dL   BUN 9 6 - 20 mg/dL   Creatinine, Ser 9.32 0.44 - 1.00 mg/dL   Calcium 9.3 8.9 - 67.1 mg/dL   Total Protein 7.3 6.5 - 8.1 g/dL   Albumin 4.1 3.5 - 5.0 g/dL   AST 29 15 - 41 U/L   ALT 41 0 - 44 U/L   Alkaline Phosphatase 66 38 - 126 U/L   Total Bilirubin 0.6 0.3 - 1.2 mg/dL   GFR, Estimated >24 >58 mL/min   Anion gap 5 5 - 15  CBC  Result Value Ref Range   WBC 4.4 4.0 - 10.5 K/uL   RBC 5.32 (H) 3.87 - 5.11 MIL/uL   Hemoglobin 13.6 12.0 - 15.0 g/dL   HCT 09.9 83.3 - 82.5 %   MCV 78.9 (L) 80.0 - 100.0 fL   MCH 25.6 (L) 26.0 - 34.0 pg   MCHC 32.4 30.0 - 36.0 g/dL   RDW 05.3 97.6 - 73.4 %   Platelets 194 150 - 400 K/uL   nRBC 0.0 0.0 - 0.2 %  Urinalysis, Routine w reflex microscopic Urine, Clean Catch  Result Value Ref Range   Color, Urine YELLOW YELLOW   APPearance HAZY (A) CLEAR   Specific Gravity, Urine 1.015 1.005 - 1.030   pH 6.0 5.0 - 8.0   Glucose, UA NEGATIVE NEGATIVE mg/dL   Hgb urine dipstick LARGE (A) NEGATIVE   Bilirubin Urine NEGATIVE NEGATIVE   Ketones, ur NEGATIVE NEGATIVE mg/dL    Protein, ur NEGATIVE NEGATIVE mg/dL   Nitrite NEGATIVE NEGATIVE  Leukocytes,Ua LARGE (A) NEGATIVE   RBC / HPF 0-5 0 - 5 RBC/hpf   WBC, UA 11-20 0 - 5 WBC/hpf   Bacteria, UA NONE SEEN NONE SEEN   Squamous Epithelial / LPF 6-10 0 - 5   Mucus PRESENT    Non Squamous Epithelial 0-5 (A) NONE SEEN  Rapid urine drug screen (hospital performed)  Result Value Ref Range   Opiates NONE DETECTED NONE DETECTED   Cocaine NONE DETECTED NONE DETECTED   Benzodiazepines NONE DETECTED NONE DETECTED   Amphetamines NONE DETECTED NONE DETECTED   Tetrahydrocannabinol NONE DETECTED NONE DETECTED   Barbiturates NONE DETECTED NONE DETECTED  I-Stat beta hCG blood, ED  Result Value Ref Range   I-stat hCG, quantitative <5.0 <5 mIU/mL   Comment 3           CT Abdomen Pelvis W Contrast  Result Date: 04/14/2022 CLINICAL DATA:  Generalized abdominal pain, nausea and vomiting, and diarrhea for 3 weeks. Acute pancreatitis. EXAM: CT ABDOMEN AND PELVIS WITH CONTRAST TECHNIQUE: Multidetector CT imaging of the abdomen and pelvis was performed using the standard protocol following bolus administration of intravenous contrast. RADIATION DOSE REDUCTION: This exam was performed according to the departmental dose-optimization program which includes automated exposure control, adjustment of the mA and/or kV according to patient size and/or use of iterative reconstruction technique. CONTRAST:  OMNIPAQUE IOHEXOL 300 MG/ML  SOLN COMPARISON:  06/16/2020 FINDINGS: Lower Chest: No acute findings. Hepatobiliary: No hepatic masses identified. Gallbladder is unremarkable. No evidence of biliary ductal dilatation. Pancreas: No evidence of pancreatic edema or peripancreatic inflammatory changes. No evidence of pancreatic mass or ductal dilatation. Spleen: Within normal limits in size and appearance. Adrenals/Urinary Tract: No masses identified. No evidence of ureteral calculi or hydronephrosis. Stomach/Bowel: No evidence of obstruction,  inflammatory process or abnormal fluid collections. Normal appendix visualized. Vascular/Lymphatic: No pathologically enlarged lymph nodes. Shotty sub-cm lymph nodes are seen throughout the small bowel mesentery, which are nonspecific but can be seen with mesenteric adenitis. No acute vascular findings. Reproductive:  No mass or other significant abnormality. Other:  None. Musculoskeletal:  No suspicious bone lesions identified. IMPRESSION: No radiographic evidence of pancreatitis. Shotty sub-cm mesenteric lymph nodes, which are nonspecific but can be seen with mesenteric adenitis. Electronically Signed   By: Danae Orleans M.D.   On: 04/14/2022 18:57      EKG None  Radiology CT Abdomen Pelvis W Contrast  Result Date: 04/14/2022 CLINICAL DATA:  Generalized abdominal pain, nausea and vomiting, and diarrhea for 3 weeks. Acute pancreatitis. EXAM: CT ABDOMEN AND PELVIS WITH CONTRAST TECHNIQUE: Multidetector CT imaging of the abdomen and pelvis was performed using the standard protocol following bolus administration of intravenous contrast. RADIATION DOSE REDUCTION: This exam was performed according to the departmental dose-optimization program which includes automated exposure control, adjustment of the mA and/or kV according to patient size and/or use of iterative reconstruction technique. CONTRAST:  OMNIPAQUE IOHEXOL 300 MG/ML  SOLN COMPARISON:  06/16/2020 FINDINGS: Lower Chest: No acute findings. Hepatobiliary: No hepatic masses identified. Gallbladder is unremarkable. No evidence of biliary ductal dilatation. Pancreas: No evidence of pancreatic edema or peripancreatic inflammatory changes. No evidence of pancreatic mass or ductal dilatation. Spleen: Within normal limits in size and appearance. Adrenals/Urinary Tract: No masses identified. No evidence of ureteral calculi or hydronephrosis. Stomach/Bowel: No evidence of obstruction, inflammatory process or abnormal fluid collections. Normal appendix  visualized. Vascular/Lymphatic: No pathologically enlarged lymph nodes. Shotty sub-cm lymph nodes are seen throughout the small bowel mesentery, which are nonspecific but  can be seen with mesenteric adenitis. No acute vascular findings. Reproductive:  No mass or other significant abnormality. Other:  None. Musculoskeletal:  No suspicious bone lesions identified. IMPRESSION: No radiographic evidence of pancreatitis. Shotty sub-cm mesenteric lymph nodes, which are nonspecific but can be seen with mesenteric adenitis. Electronically Signed   By: Danae Orleans M.D.   On: 04/14/2022 18:57    Procedures Procedures    Medications Ordered in ED Medications  ondansetron (ZOFRAN-ODT) disintegrating tablet 4 mg (4 mg Oral Given 04/14/22 1512)  iohexol (OMNIPAQUE) 300 MG/ML solution 100 mL (100 mLs Intravenous Contrast Given 04/14/22 1839)  sodium chloride 0.9 % bolus 1,000 mL (1,000 mLs Intravenous New Bag/Given 04/14/22 1851)  HYDROmorphone (DILAUDID) injection 0.5 mg (0.5 mg Intravenous Given 04/14/22 1850)  ondansetron (ZOFRAN) injection 4 mg (4 mg Intravenous Given 04/14/22 1849)    ED Course/ Medical Decision Making/ A&P                           Medical Decision Making Problems Addressed: Abdominal pain, generalized: acute illness or injury with systemic symptoms that poses a threat to life or bodily functions Mesenteric adenitis: acute illness or injury with systemic symptoms Nausea: acute illness or injury with systemic symptoms  Amount and/or Complexity of Data Reviewed Independent Historian:     Details: friendSO - hx External Data Reviewed: notes. Labs: ordered. Decision-making details documented in ED Course. Radiology: ordered and independent interpretation performed. Decision-making details documented in ED Course.  Risk Prescription drug management. Parenteral controlled substances. Decision regarding hospitalization.  Iv ns. Continuous pulse ox and cardiac monitoring. Labs  ordered/sent. Imaging ordered.   Diff dx includes pancreatitis, sbo, gastritis, ibs, etc - dispo decision including potential need for admission considered if gross lab/ct abn - will get labs and imaging and reassess.   Reviewed nursing notes and prior charts for additional history. External reports reviewed. Additional history from: sig other.   Dilaudid iv, zofran iv, ns bolus.   Cardiac monitor: sinus rhythm, rate 68.  Labs reviewed/interpreted by me - wbc normal.  Pt denies dysuria. Denies fever. No cva tenderness.   CT reviewed/interpreted by me - no acute infectious ?possible mesenteric adenitis.   Po fluids.  No nv.   Abd soft nt.  Pt appears stable for d/c.   Return precautions provided.   Pt requests pain rs for home. Will give small quantity rx ultram.           Final Clinical Impression(s) / ED Diagnoses Final diagnoses:  None    Rx / DC Orders ED Discharge Orders     None         Cathren Laine, MD 04/14/22 2031

## 2022-04-14 NOTE — ED Notes (Signed)
Pt states "stomach hurts really bad".

## 2022-04-14 NOTE — Discharge Instructions (Addendum)
It was our pleasure to provide your ER care today - we hope that you feel better.  Drink plenty of fluids/stay well hydrated.   You may take ibuprofen or acetaminophen as need for pain. You may also take ultram as need for pain - no driving when taking. You may take zofran as need for nausea.  Follow up closely with GI specialist in the next 1-2 weeks - call office Monday to arrange appointment.   Return to ER if worse, new symptoms, fevers, new, worsening, or severe abdominal pain, persistent vomiting, or other concern.   You were given pain meds in the ER - no driving for the next 6 hours, or when taking ultram.

## 2022-04-14 NOTE — ED Triage Notes (Signed)
Patient c/o generalized abdominal pain  and N/v/D x 2-3 weeks.  Patient was at a [facility] today and was referred to the ED for a CT Scan.

## 2022-04-20 ENCOUNTER — Encounter: Payer: Self-pay | Admitting: Physician Assistant

## 2022-04-20 ENCOUNTER — Ambulatory Visit (INDEPENDENT_AMBULATORY_CARE_PROVIDER_SITE_OTHER): Payer: Medicaid Other | Admitting: Physician Assistant

## 2022-04-20 VITALS — BP 118/70 | HR 69 | Ht 63.0 in | Wt 232.6 lb

## 2022-04-20 DIAGNOSIS — R110 Nausea: Secondary | ICD-10-CM

## 2022-04-20 DIAGNOSIS — K5900 Constipation, unspecified: Secondary | ICD-10-CM

## 2022-04-20 DIAGNOSIS — Z713 Dietary counseling and surveillance: Secondary | ICD-10-CM

## 2022-04-20 DIAGNOSIS — R140 Abdominal distension (gaseous): Secondary | ICD-10-CM | POA: Diagnosis not present

## 2022-04-20 NOTE — Patient Instructions (Addendum)
Start Miralax 1 capful daily in 8 ounces of liquid, may increase to twice daily if not helping.  REFERRAL:  A referral, your demographics, a copy of your insurance card and your records will be sent to [facility]. You will receive a call from their office regarding the date, time and location of your appointment.  _______________________________________________________  If you are age 64 or older, your body mass index should be between 23-30. Your Body mass index is 41.2 kg/m. If this is out of the aforementioned range listed, please consider follow up with your Primary Care Provider.  If you are age 7 or younger, your body mass index should be between 19-25. Your Body mass index is 41.2 kg/m. If this is out of the aformentioned range listed, please consider follow up with your Primary Care Provider.   ________________________________________________________  The [facility] providers would like to encourage you to use [facility] to communicate with providers for non-urgent requests or questions.  Due to long hold times on the telephone, sending your provider a message by [facility] may be a faster and more efficient way to get a response.  Please allow 48 business hours for a response.  Please remember that this is for non-urgent requests.  _______________________________________________________

## 2022-04-20 NOTE — Progress Notes (Signed)
Chief Complaint: Abdominal pain and bloating  HPI:    Alexandra [name] is an 21 year old Hispanic female with a past medical history as listed below including Ehrlos Danlos, who was referred to me by [facility] for a complaint of abdominal pain and bloating.      04/14/2022 patient initially seen in the urgent care for generalized abdominal pain and sent to the ER.  Described mid diffuse abdominal pain for 2 to 3 weeks.  The symptoms were constant.  At that time labs showed normal CMP, CBC and lipase.  Patient had a CT of the abdomen pelvis which showed shotty subcentimeter mesenteric lymph nodes which were nonspecific but could be seen with mesenteric adenitis.    Today, the patient presents to clinic and tells me that she was diagnosed with IBS a few years ago by her pediatric gastroenterologist.  Tells me that she is always troubled with stools that go back and forth from constipation to diarrhea as well as bloating and some nausea.  Apparently at one point was on Desipramine which seemed to help her with the symptoms and also helped her with her occasional urinary incontinence.  Most recently she had an increase in pain and was seen in the ER as above.  Tells me that since that time she has not had as much pain but remains constipated with a bowel movement maybe once every 3 days and a lot of bloating and occasional nausea which is worse when she is constipated.  Tells me the symptoms seem to come and go but are there typically 3 to 4 days out of the week but then can be gone for couple weeks at a time.  Really they do not have any pattern.  Patient has never tried any over-the-counter laxatives.  Tells me her diet varies as she lives with her boyfriend and sometimes eat healthy and other times they do not.  She is asking about weight loss today.    Denies fever, chills, weight loss or blood in her stool.     Past Medical History:  Diagnosis Date   Anxiety    Dry skin    Eating  disorder    Ehlers-Danlos disease    Fatty liver    POTS (postural orthostatic tachycardia syndrome)    Vision abnormalities     Past Surgical History:  Procedure Laterality Date   DENTAL SURGERY     TYMPANOSTOMY TUBE PLACEMENT      Current Outpatient Medications  Medication Sig Dispense Refill   hydrOXYzine (ATARAX) 10 MG tablet Take 1 tablet (10 mg total) by mouth 3 (three) times daily as needed. 180 tablet 1   ondansetron (ZOFRAN-ODT) 8 MG disintegrating tablet Take 1 tablet (8 mg total) by mouth every 8 (eight) hours as needed for nausea or vomiting. 10 tablet 0   propranolol (INDERAL) 10 MG tablet Take 1 tablet (10 mg total) by mouth 2 (two) times daily. 60 tablet 6   fluconazole (DIFLUCAN) 150 MG tablet Take 1 tablet today and 1 tablet 3 days from now (Patient not taking: Reported on 03/10/2022) 2 tablet 0   hydrOXYzine (ATARAX) 50 MG tablet Take 1 tablet (50 mg total) by mouth at bedtime. (Patient not taking: Reported on 04/20/2022) 90 tablet 1   Prenatal Vit-Fe Fumarate-FA (PRENATAL VITAMIN PLUS LOW IRON) 27-1 MG TABS Take 1 tablet by mouth daily at 12 noon. (Patient not taking: Reported on 04/20/2022) 90 tablet 3   traMADol (ULTRAM) 50 MG tablet Take  1 tablet (50 mg total) by mouth every 6 (six) hours as needed. (Patient not taking: Reported on 04/20/2022) 10 tablet 0   No current facility-administered medications for this visit.    Allergies as of 04/20/2022 - Review Complete 04/20/2022  Allergen Reaction Noted   Bee pollen Other (See Comments) 03/21/2017   Pollen extract  03/21/2017    Family History  Problem Relation Age of Onset   Hyperthyroidism Mother    Diabetes Mother    Asthma Father    Cataracts Sister    Strabismus Sister    Non-Hodgkin's lymphoma Brother    Cancer Brother    Colon cancer Neg Hx    Stomach cancer Neg Hx    Esophageal cancer Neg Hx    Colon polyps Neg Hx     Social History   Socioeconomic History   Marital status: Single    Spouse  name: Not on file   Number of children: 0   Years of education: Not on file   Highest education level: Not on file  Occupational History   Occupation: Unemployed  Tobacco Use   Smoking status: Never    Passive exposure: Yes   Smokeless tobacco: Never   Tobacco comments:    family smokes outside  Psychologist, educational Use   Vaping Use: Never used  Substance and Sexual Activity   Alcohol use: No   Drug use: No   Sexual activity: Never    Birth control/protection: Abstinence, Pill    Comment: pt. takes birth control pills for cycle regulation  Other Topics Concern   Not on file  Social History Narrative   [employer]th [employer] 22-23 school - lives with brother, sister, step father and mother.    Social Determinants of Health   Financial Resource Strain: Medium Risk (12/30/2021)   Overall Financial Resource Strain (CARDIA)    Difficulty of Paying Living Expenses: Somewhat hard  Food Insecurity: No Food Insecurity (12/30/2021)   Hunger Vital Sign    Worried About Running Out of Food in the Last Year: Never true    Ran Out of Food in the Last Year: Never true  Transportation Needs: Unmet Transportation Needs (03/06/2022)   PRAPARE - Administrator, Civil Service (Medical): Yes    Lack of Transportation (Non-Medical): Yes  Physical Activity: Not on file  Stress: Not on file  Social Connections: Not on file  Intimate Partner Violence: Not on file    Review of Systems:    Constitutional: No weight loss, fever or chills Skin: No rash  Cardiovascular: No chest pain Respiratory: No SOB  Gastrointestinal: See HPI and otherwise negative Genitourinary: No dysuria  Neurological: No headache, dizziness or syncope Musculoskeletal: No new muscle or joint pain Hematologic: No bleeding Psychiatric: No history of depression or anxiety   Physical Exam:  Vital signs: BP 118/70   Pulse 69   Ht 5\' 3"  (1.6 m)   Wt 232 lb 9.6 oz (105.5 kg)   LMP 04/13/2022 (Approximate)   SpO2 98%   BMI  41.20 kg/m    Constitutional:   Pleasant obese Hispanic female appears to be in NAD, Well developed, Well nourished, alert and cooperative Head:  Normocephalic and atraumatic. Eyes:   PEERL, EOMI. No icterus. Conjunctiva pink. Ears:  Normal auditory acuity. Neck:  Supple Throat: Oral cavity and pharynx without inflammation, swelling or lesion.  Respiratory: Respirations even and unlabored. Lungs clear to auscultation bilaterally.   No wheezes, crackles, or rhonchi.  Cardiovascular: Normal S1, S2. No  MRG. Regular rate and rhythm. No peripheral edema, cyanosis or pallor.  Gastrointestinal:  Soft, nondistended, mild LLQ ttp, No rebound or guarding. Normal bowel sounds. No appreciable masses or hepatomegaly. Rectal:  Not performed.  Msk:  Symmetrical without gross deformities. Without edema, no deformity or joint abnormality.  Neurologic:  Alert and  oriented x4;  grossly normal neurologically.  Skin:   Dry and intact without significant lesions or rashes. Psychiatric: Demonstrates good judgement and reason without abnormal affect or behaviors.  RELEVANT LABS AND IMAGING: CBC    Component Value Date/Time   WBC 4.4 04/14/2022 1438   RBC 5.32 (H) 04/14/2022 1438   HGB 13.6 04/14/2022 1438   HCT 42.0 04/14/2022 1438   PLT 194 04/14/2022 1438   MCV 78.9 (L) 04/14/2022 1438   MCH 25.6 (L) 04/14/2022 1438   MCHC 32.4 04/14/2022 1438   RDW 13.8 04/14/2022 1438   LYMPHSABS 2,030 02/16/2022 1701   MONOABS 0.5 05/02/2021 1708   EOSABS 160 02/16/2022 1701   BASOSABS 22 02/16/2022 1701    CMP     Component Value Date/Time   NA 137 04/14/2022 1438   K 4.1 04/14/2022 1438   CL 112 (H) 04/14/2022 1438   CO2 20 (L) 04/14/2022 1438   GLUCOSE 101 (H) 04/14/2022 1438   BUN 9 04/14/2022 1438   CREATININE 0.87 04/14/2022 1438   CREATININE 0.79 02/16/2022 1701   CALCIUM 9.3 04/14/2022 1438   PROT 7.3 04/14/2022 1438   ALBUMIN 4.1 04/14/2022 1438   AST 29 04/14/2022 1438   ALT 41 04/14/2022  1438   ALKPHOS 66 04/14/2022 1438   BILITOT 0.6 04/14/2022 1438   GFRNONAA >60 04/14/2022 1438   GFRAA NOT CALCULATED 03/21/2017 1856    Assessment: 1.  Lower quadrant pain: On exam today some tenderness in the left lower quadrant, she is currently constipated; likely related to constipation +/- IBS 2.  Constipation: As above 3.  Bloating and nausea  Plan: 1.  Discussed with patient that would recommend she take MiraLAX on a daily basis, this can be titrated up to 4 times a day if needed for constipation.  Hopefully when she is having more regular stools she will have decreased left lower quadrant pain and nausea. 2.  When reviewing her records it looks like she was on Desipramine in the past, this may be discussed further with her primary care physician, but could be can option for her in the future if it worked in the past. 3.  Patient requested referral to a dietitian for weight loss and her IBS. 4.  Patient assigned to Dr. Chales Abrahams today.  She will follow in clinic with me in 2 months.  Hyacinth Meeker, PA-C [facility] 04/20/2022, 9:25 AM  Cc: [facility]

## 2022-04-22 NOTE — Progress Notes (Signed)
Agree with assessment/plan.  Raj Donya Tomaro, MD [facility] [phone]  

## 2022-04-24 NOTE — Telephone Encounter (Signed)
Medication list updated.

## 2022-04-30 ENCOUNTER — Encounter: Payer: Self-pay | Admitting: Family

## 2022-05-01 ENCOUNTER — Telehealth: Payer: Self-pay | Admitting: Family

## 2022-05-01 NOTE — Telephone Encounter (Signed)
TC to patient to discuss referral for psychiatry for ongoing medication management. Patient agreeable for referral; did note that she was trying to find a job so would not be available for appointments as she was before. Advised that Alexandra [name] and I will work to find referral and reach out with options.

## 2022-05-04 ENCOUNTER — Encounter: Payer: Self-pay | Admitting: Family

## 2022-05-04 MED ORDER — PANTOPRAZOLE SODIUM 40 MG PO TBEC: 40.0000 mg | DELAYED_RELEASE_TABLET | Freq: Every day | ORAL | 3 refills | Status: DC

## 2022-05-05 ENCOUNTER — Encounter: Payer: Self-pay | Admitting: Cardiology

## 2022-05-05 ENCOUNTER — Encounter: Payer: Self-pay | Admitting: Family

## 2022-05-09 ENCOUNTER — Other Ambulatory Visit: Payer: Self-pay | Admitting: Family

## 2022-05-15 ENCOUNTER — Telehealth: Payer: Self-pay | Admitting: Cardiology

## 2022-05-15 NOTE — Telephone Encounter (Signed)
Pt c/o of Chest Pain: STAT if CP now or developed within 24 hours  1. Are you having CP right now? A little chest pain at this time  2. Are you experiencing any other symptoms (ex. SOB, nausea, vomiting, sweating)? Short of breath, l heart beats real fast when she have chest pains, ightheaded, limbs are turning red and purple  3. How long have you been experiencing CP? Going on 2 weeks  4. Is your CP continuous or coming and going? Comes and goes, but stays for a long time  5. Have you taken Nitroglycerin? She does not have any- patient wanted an appointment- she could not come tomorrow, I made ana ppointment for Wednesday with Herma Carson ?

## 2022-05-15 NOTE — Telephone Encounter (Signed)
Spoke with pt who complains of intermittent CP, SOB, racing heart, lightheadedness and limbs turning red and purple when standing for long periods of time x 1-2 weeks.  Pt last seen by Dr Shari Prows 03/2022.  Diagnosed with POTS.  Pt states she is drinking a lot of water but does not know how much daily.  She states she does drink soda at times.  She is not doing salt supplementation other than adding some salt to her food.  She is taking Propranolol as prescribed.  She is consistently wearing compression shorts.  Encouraged pt to continue hydration and adding Liquid IV or Pedialyte [facility] to increase salt intake and not to drink soda or drinks with caffeine .  Consistently wear compression.  Pt is scheduled to see Jacolyn Reedy, PA-C on 05/17/2022.  Reviewed ED precautions.  Pt verbalizes understanding and agrees with current plan.

## 2022-05-16 NOTE — Progress Notes (Unsigned)
Cardiology Office Note:    Date:  05/17/2022   ID:  Alexandra [name], DOB [birth year], MRN [id number]  PCP:  [facility]  [facility] Providers Cardiologist:  Meriam Sprague, MD     Referring MD: [facility]   Chief Complaint:  Palpitations     History of Present Illness:   Alexandra [name] is a 21 y.o. female  with a hx of anxiety, depression and POTS.  She was initially seen in 08/2021 for evaluation for POTs. Orthostatics revealed postural tachycardia without drop in blood pressure. She was started on metop 12.5mg  BID.   Patient last saw Dr. Shari Prows 03/10/22 and was having palpitations with activity. Metoprolol helped heart rates but she had N/V and lightheadedness so stopped it.  She was given a trial of propanolol, asked to hydrate, salt tabs, compression hose.    She called in yest palpitations chest pain, dyspnea, lightheadedness for 1-2 weeks. Wasn't doing salt supplementation. She is here with her boyfriend's mother. She is having a lot of palpitations, chest pain. Not wearing compression hose. The other day she said her heart was racing and she took her boyfriend's mother's flecainide. She doesn't think the propanolol is working. Drinking 2 soda's a day.  Drinks about 6 bottles of water daily. Lots of stress with biological mother.    Past Medical History:  Diagnosis Date   Anxiety    Dry skin    Eating disorder    Ehlers-Danlos disease    Fatty liver    POTS (postural orthostatic tachycardia syndrome)    Vision abnormalities    Current Medications: Current Meds  Medication Sig   FLUoxetine (PROZAC) 20 MG capsule Take 20 mg by mouth daily.   hydrOXYzine (ATARAX) 25 MG tablet Take 25 mg by mouth 3 (three) times daily as needed.   hydrOXYzine (ATARAX) 50 MG tablet Take 1 tablet (50 mg total) by mouth at bedtime.   ondansetron (ZOFRAN-ODT) 8 MG disintegrating tablet Take 1 tablet (8 mg total) by mouth  every 8 (eight) hours as needed for nausea or vomiting.   pantoprazole (PROTONIX) 40 MG tablet Take 1 tablet (40 mg total) by mouth daily. 30 minutes before breakfast   Prenatal Vit-Fe Fumarate-FA (PRENATAL VITAMIN PLUS LOW IRON) 27-1 MG TABS Take 1 tablet by mouth daily at 12 noon.   propranolol (INDERAL) 20 MG tablet Take 1 tablet (20 mg total) by mouth 2 (two) times daily.   [DISCONTINUED] propranolol (INDERAL) 10 MG tablet Take 1 tablet (10 mg total) by mouth 2 (two) times daily.    Allergies:   Bee pollen and Pollen extract   Social History   Tobacco Use   Smoking status: Never    Passive exposure: Yes   Smokeless tobacco: Never   Tobacco comments:    family smokes outside  Vaping Use   Vaping Use: Never used  Substance Use Topics   Alcohol use: No   Drug use: No    Family Hx: The patient's family history includes Asthma in her father; Cancer in her brother; Cataracts in her sister; Diabetes in her mother; Hyperthyroidism in her mother; Non-Hodgkin's lymphoma in her brother; Strabismus in her sister. There is no history of Colon cancer, Stomach cancer, Esophageal cancer, or Colon polyps.  ROS   EKGs/Labs/Other Test Reviewed:    EKG:  EKG is   ordered today.  The ekg ordered today demonstrates NSR normal EKG  Recent Labs: 04/14/2022: ALT 41; BUN  9; Creatinine, Ser 0.87; Hemoglobin 13.6; Platelets 194; Potassium 4.1; Sodium 137   Recent Lipid Panel Recent Labs    02/16/22 1701  CHOL 154  TRIG 200*  HDL 35*  LDLCALC 89     Prior CV Studies:        Risk Assessment/Calculations/Metrics:              Physical Exam:    VS:  BP 118/62   Pulse 75   Ht 5\' 3"  (1.6 m)   Wt 230 lb (104.3 kg)   SpO2 97%   BMI 40.74 kg/m     Wt Readings from Last 3 Encounters:  05/17/22 230 lb (104.3 kg) (99 %, Z= 2.30)*  04/20/22 232 lb 9.6 oz (105.5 kg) (99 %, Z= 2.33)*  04/14/22 232 lb (105.2 kg) (99 %, Z= 2.32)*   * Growth percentiles are based on CDC (Girls, 2-20 Years)  data.    Physical Exam  GEN: Obese, in no acute distress  Neck: no JVD, carotid bruits, or masses Cardiac:RRR; no murmurs, rubs, or gallops  Respiratory:  clear to auscultation bilaterally, normal work of breathing GI: soft, nontender, nondistended, + BS Ext: without cyanosis, clubbing, or edema, Good distal pulses bilaterally Neuro:  Alert and Oriented x 3,  Psych: euthymic mood, full affect       ASSESSMENT & PLAN:   No problem-specific Assessment & Plan notes found for this encounter.   POTS- worsening symptoms recently and took her boyfriend's mother's flecainide. I asked her to absolutely not do this again. Will increase propanolol 20 mg bid, hydrate, salt tabs vs gatorade zero, compression socks. See gynecologist about birth control that could be contributing to symptoms.    Hypermobile EDS          Dispo:  No follow-ups on file.   Medication Adjustments/Labs and Tests Ordered: Current medicines are reviewed at length with the patient today.  Concerns regarding medicines are outlined above.  Tests Ordered: Orders Placed This Encounter  Procedures   EKG 12-Lead   Medication Changes: Meds ordered this encounter  Medications   propranolol (INDERAL) 20 MG tablet    Sig: Take 1 tablet (20 mg total) by mouth 2 (two) times daily.    Dispense:  60 tablet    Refill:  [address], PA-C  05/17/2022 12:53 PM    [facility] [address] [place], [place], [place]  [place] Phone: [phone]; Fax: [phone]

## 2022-05-17 ENCOUNTER — Encounter: Payer: Self-pay | Admitting: Physician Assistant

## 2022-05-17 ENCOUNTER — Ambulatory Visit: Payer: Medicaid Other | Attending: Nurse Practitioner | Admitting: Physician Assistant

## 2022-05-17 VITALS — BP 118/62 | HR 75 | Ht 63.0 in | Wt 230.0 lb

## 2022-05-17 DIAGNOSIS — Q796 Ehlers-Danlos syndrome, unspecified: Secondary | ICD-10-CM | POA: Insufficient documentation

## 2022-05-17 DIAGNOSIS — G90A Postural orthostatic tachycardia syndrome (POTS): Secondary | ICD-10-CM | POA: Diagnosis not present

## 2022-05-17 MED ORDER — PROPRANOLOL HCL 20 MG PO TABS: 20.0000 mg | ORAL_TABLET | Freq: Two times a day (BID) | ORAL | 11 refills | Status: DC

## 2022-05-17 NOTE — Patient Instructions (Signed)
Medication Instructions:  1.Increase propranolol to 20 mg twice daily *If you need a refill on your cardiac medications before your next appointment, please call your pharmacy*   Lab Work: None If you have labs (blood work) drawn today and your tests are completely normal, you will receive your results only by: MyChart Message (if you have MyChart) OR A paper copy in the mail If you have any lab test that is abnormal or we need to change your treatment, we will call you to review the results.  Follow-Up: At [facility], you and your health needs are our priority.  As part of our continuing mission to provide you with exceptional heart care, we have created designated Provider Care Teams.  These Care Teams include your primary Cardiologist (physician) and Advanced Practice Providers (APPs -  Physician Assistants and Nurse Practitioners) who all work together to provide you with the care you need, when you need it.  Your next appointment:   Next available   The format for your next appointment:   In Person  Provider:   Meriam Sprague, MD    Important Information About Sugar

## 2022-05-18 ENCOUNTER — Ambulatory Visit: Payer: Medicaid Other | Admitting: Nurse Practitioner

## 2022-05-28 ENCOUNTER — Other Ambulatory Visit: Payer: Self-pay

## 2022-05-28 ENCOUNTER — Encounter (HOSPITAL_COMMUNITY): Payer: Self-pay

## 2022-05-28 ENCOUNTER — Emergency Department (HOSPITAL_COMMUNITY)
Admission: EM | Admit: 2022-05-28 | Discharge: 2022-05-29 | Disposition: A | Discharge status: Home / Self Care | Payer: Medicaid Other | Attending: Emergency Medicine | Admitting: Emergency Medicine

## 2022-05-28 DIAGNOSIS — M545 Low back pain, unspecified: Secondary | ICD-10-CM | POA: Diagnosis present

## 2022-05-28 DIAGNOSIS — M5442 Lumbago with sciatica, left side: Secondary | ICD-10-CM | POA: Insufficient documentation

## 2022-05-28 MED ORDER — KETOROLAC TROMETHAMINE 30 MG/ML IJ SOLN
30.0000 mg | Freq: Once | INTRAMUSCULAR | Status: AC
  Administered 2022-05-29: 30 mg via INTRAMUSCULAR
  Filled 2022-05-28: qty 1

## 2022-05-28 MED ORDER — HYDROCODONE-ACETAMINOPHEN 5-325 MG PO TABS
1.0000 | ORAL_TABLET | Freq: Once | ORAL | Status: AC
  Administered 2022-05-29: 1 via ORAL
  Filled 2022-05-28: qty 1

## 2022-05-28 NOTE — ED Triage Notes (Addendum)
Pt states that she has a joint condition and is unable to stand x 2 days. Pt reports being able to walk per baseline. Pt reports taking muscle relaxers, tylenol, and ibuprofen with no relief.

## 2022-05-29 ENCOUNTER — Encounter: Payer: Self-pay | Admitting: Physician Assistant

## 2022-05-29 ENCOUNTER — Emergency Department (HOSPITAL_COMMUNITY): Payer: Medicaid Other

## 2022-05-29 ENCOUNTER — Ambulatory Visit (INDEPENDENT_AMBULATORY_CARE_PROVIDER_SITE_OTHER): Payer: Medicaid Other | Admitting: Physician Assistant

## 2022-05-29 DIAGNOSIS — G8929 Other chronic pain: Secondary | ICD-10-CM | POA: Diagnosis not present

## 2022-05-29 DIAGNOSIS — M545 Low back pain, unspecified: Secondary | ICD-10-CM

## 2022-05-29 LAB — I-STAT BETA HCG BLOOD, ED (MC, WL, AP ONLY): I-stat hCG, quantitative: <5 m[IU]/mL (ref <5)

## 2022-05-29 MED ORDER — METHYLPREDNISOLONE 4 MG PO TBPK: ORAL_TABLET | ORAL | 0 refills | Status: DC

## 2022-05-29 NOTE — Progress Notes (Signed)
Office Visit Note   Patient: Alexandra [name]           Date of Birth: 05-10-2001           MRN: [id number] Visit Date: 05/29/2022              Requested by: [facility] [address],  [place] [address] PCP: [facility]   Assessment & Plan: Visit Diagnoses:  1. Chronic low back pain, unspecified back pain laterality, unspecified whether sciatica present     Plan: Patient is a pleasant 21 year old who is accompanied by her mother-in-law.  She has a 2-year history of low back pain.  It radiates down both legs.  She had an exacerbation of this over the weekend and went to the emergency department yesterday.  She was given some pain medicine as well as a steroid taper however said that was not filled for her.  She denies any loss of bowel or bladder control.  She has had lumbar spine films which were negative for any osseous injuries.  She has had a MRI of her lumbar spine in March 2022.  This demonstrated some disc desiccation with mild disc bulge at L5-S1 no significant canal or lateral recess stenosis small central disc protrusion could affect the S1 nerve roots otherwise unremarkable.  Her medical history is significant for history of Ehlers-Danlos syndrome.  She does have a back support but does not feel it is fitting her properly.  I will give her a prescription to try and get a new one from [place].  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  I do think she would benefit from a back stabilization program with physical therapy.  We will also place her on a steroid taper and prescribe this for her today.  She will follow-up with Dr. Laurance Flatten in approximately a month.  Sooner if she has any concerns I did tell her to take the steroid taper with food.  Also should stop it if she has any GI upset.  She understands not to take any other  anti-inflammatories while she is on this medication  Follow-Up Instructions: No follow-ups on file.   Orders:  Orders Placed This Encounter  Procedures   Ambulatory referral to Physical Therapy   No orders of the defined types were placed in this encounter.     Procedures: No procedures performed   Clinical Data: No additional findings.   Subjective: Chief Complaint  Patient presents with   Lower Back - Pain    HPI patient is a pleasant 21 year old with a 2-year history of back pain.  No injury but she does have a history of Ehlers-Danlos syndrome.  She had some increased pain over the weekend no fever or chills was seen in the emergency department where x-rays did not demonstrate any acute abnormalities follows up today denies any loss of bowel or bladder control  Review of Systems   Objective: Vital Signs: There were no vitals taken for this visit.  Physical Exam Constitutional:      Appearance: Normal appearance.  Pulmonary:     Effort: Pulmonary effort is normal.  Skin:    General: Skin is warm and dry.  Neurological:     General: No focal deficit present.     Mental Status: She is alert.     Ortho Exam Examination of her  lower spine cannot palpate any deformity.  No redness no erythema she has good strength 5 out of 5 sensation is intact equal strength with resisted plantarflexion dorsiflexion extension and flexion of knees and flexion of hips.  No pain with manipulation of her hips. Specialty Comments:  No specialty comments available.  Imaging: DG Lumbar Spine Complete  Result Date: 05/29/2022 CLINICAL DATA:  Pain; unable to stand for 2 days; history of Ehlers-Danlos EXAM: LUMBAR SPINE - COMPLETE 4+ VIEW COMPARISON:  MR lumbar spine 11/17/2020 and radiographs 11/16/2020 FINDINGS: There is no evidence of lumbar spine fracture. Alignment is normal. Intervertebral disc spaces are maintained. IMPRESSION: Negative. Electronically Signed   By: Placido Sou  M.D.   On: 05/29/2022 00:57   DG Pelvis 1-2 Views  Result Date: 05/29/2022 CLINICAL DATA:  Pain; unable to stand for 2 days; history of Ehlers Danlos EXAM: PELVIS - 1-2 VIEW COMPARISON:  CT abdomen and pelvis 04/14/2022 FINDINGS: There is no evidence of pelvic fracture or diastasis. No pelvic bone lesions are seen. IMPRESSION: Negative. Electronically Signed   By: Placido Sou M.D.   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   Mixed hyperlipidemia 10/13/2021   PTSD (post-traumatic stress disorder) 09/21/2021   Transient alteration of awareness 09/21/2021   Sleep disturbance 08/10/2021   Low back pain 02/02/2021   POTS (postural orthostatic tachycardia syndrome) 01/20/2021   Bulging of lumbar intervertebral disc 12/13/2020   Constipation 12/13/2020   Easy bruising 12/13/2020   Ehlers-Danlos syndrome 12/13/2020   Weight loss 11/16/2020   Chronic bilateral thoracic back pain 11/16/2020   Urinary incontinence without sensory awareness 11/16/2020   Hepatomegaly 06/16/2020   GAD (generalized anxiety disorder) 05/12/2020   Abnormal auditory perception of right ear 05/12/2020   Generalized abdominal pain 06/13/2018   Chronic fatigue 06/13/2018   Generalized hypermobility of joints 06/13/2018   Patellar subluxation 06/13/2018   Joint pain 06/13/2018   Self-injurious behavior 08/09/2017   Gastroesophageal reflux disease 08/09/2017   Attention deficit hyperactivity disorder (ADHD), combined type 08/09/2017   MDD (major depressive disorder), recurrent severe, 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/21/2017   Chronic nonintractable headache 01/22/2017   Insomnia 01/11/2017   Dizziness 01/04/2017   Suicidal ideation 12/04/2016   Anorexia nervosa with bulimia 11/13/2016   Past Medical History:  Diagnosis Date   Anxiety    Dry skin    Eating disorder    Ehlers-Danlos  disease    Fatty liver    POTS (postural orthostatic tachycardia syndrome)    Vision abnormalities     Family History  Problem Relation Age of Onset   Hyperthyroidism Mother    Diabetes Mother    Asthma Father    Cataracts Sister    Strabismus Sister    Non-Hodgkin's lymphoma Brother    Cancer Brother    Colon cancer Neg Hx    Stomach cancer Neg Hx    Esophageal cancer Neg Hx    Colon polyps Neg Hx     Past Surgical History:  Procedure Laterality Date   DENTAL SURGERY     TYMPANOSTOMY TUBE PLACEMENT     Social History   Occupational History   Occupation: Unemployed  Tobacco Use   Smoking status: Never    Passive exposure: Yes   Smokeless tobacco: Never   Tobacco comments:    family smokes outside  [place]  Use: Never used  Substance and Sexual Activity   Alcohol use: No   Drug use: No   Sexual activity: Never    Birth control/protection: Abstinence, Pill    Comment: pt. takes birth control pills for cycle regulation

## 2022-05-29 NOTE — ED Provider Notes (Signed)
[facility] Emergency Department Provider Note MRN:  [id number]  Arrival date & time: 05/29/22     Chief Complaint   Back Pain   History of Present Illness   Alexandra [name] is a 21 y.o. year-old female presents to the ED with chief complaint of low back pain.  Has history of the same.  States that she has pain radiating down her left buttock and down her left leg.  Denies bowel or bladder incontinence.  She has tried taking muscle relaxer, Tylenol, and ibuprofen without relief.  Denies any other associated symptoms.  Hx of Ehlers-Danlos  History provided by patient.   Review of Systems  Pertinent positive and negative review of systems noted in HPI.    Physical Exam   Vitals:   05/29/22 0030 05/29/22 0115  BP: (!) 114/57 120/68  Pulse: 68 70  Resp: 20 20  Temp:  98.2 F (36.8 C)  SpO2: 100% 99%    CONSTITUTIONAL:  well-appearing, NAD NEURO:  Alert and oriented x 3, CN 3-12 grossly intact EYES:  eyes equal and reactive ENT/NECK:  Supple, no stridor  CARDIO:  normal rate, regular rhythm, appears well-perfused  PULM:  No respiratory distress,  GI/GU:  non-distended,  MSK/SPINE:  No gross deformities, no edema, moves all extremities, left lumbar paraspinal muscle tenderness SKIN:  no rash, atraumatic   *Additional and/or pertinent findings included in MDM below  Diagnostic and Interventional Summary    EKG Interpretation  Date/Time:    Ventricular Rate:    PR Interval:    QRS Duration:   QT Interval:    QTC Calculation:   R Axis:     Text Interpretation:         Labs Reviewed  I-STAT BETA HCG BLOOD, ED ([facility], [facility], [facility] ONLY)    DG Pelvis 1-2 Views  Final Result    DG Lumbar Spine Complete  Final Result      Medications  ketorolac (TORADOL) 30 MG/ML injection 30 mg (30 mg Intramuscular Given 05/29/22 0003)  HYDROcodone-acetaminophen (NORCO/VICODIN) 5-325 MG per tablet 1 tablet (1 tablet Oral Given 05/29/22 0003)      Procedures  /  Critical Care Procedures  ED Course and Medical Decision Making  I have reviewed the triage vital signs, the nursing notes, and pertinent available records from the EMR.  Social Determinants Affecting Complexity of Care: Patient has no clinically significant social determinants affecting this chief complaint..   ED Course:    Medical Decision Making Patient with back pain.    No neurological deficits and normal neuro exam.  Patient is ambulatory.  No loss of bowel or bladder control.  Doubt cauda equina.  Denies fever,  doubt epidural abscess or other lesion. Recommend back exercises, stretching, RICE, and will treat with a short course of prednisone.  Review of prior charts show lumbar disc protrusion.  Encouraged the patient that there could be a need for additional workup and/or imaging such as MRI, if the symptoms do not resolve. Patient advised that if the back pain does not resolve, or radiates, this could progress to more serious conditions and is encouraged to follow-up with PCP or orthopedics within 2 weeks.     Amount and/or Complexity of Data Reviewed Radiology: ordered.  Risk Prescription drug management.     Consultants: No consultations were needed in caring for this patient.   Treatment and Plan: Emergency department workup does not suggest an emergent condition requiring admission or immediate intervention beyond  what  has been performed at this time. The patient is safe for discharge and has  been instructed to return immediately for worsening symptoms, change in  symptoms or any other concerns    Final Clinical Impressions(s) / ED Diagnoses     ICD-10-CM   1. Acute left-sided low back pain with left-sided sciatica  M54.42       ED Discharge Orders     None         Discharge Instructions Discussed with and Provided to Patient:    Discharge Instructions      Please follow-up with your orthopedic and physical therapist.   Take medications as prescribed.        Roxy Horseman, PA-C 05/29/22 3846    Tilden Fossa, MD 05/29/22 [phone]

## 2022-05-29 NOTE — Discharge Instructions (Signed)
Please follow-up with your orthopedic and physical therapist.  Take medications as prescribed.

## 2022-05-30 ENCOUNTER — Encounter: Payer: Self-pay | Admitting: Family

## 2022-05-31 ENCOUNTER — Telehealth: Payer: Self-pay | Admitting: Cardiology

## 2022-05-31 NOTE — Telephone Encounter (Signed)
Calling to get dx code for compression socks. Please advise

## 2022-05-31 NOTE — Telephone Encounter (Signed)
[place] back.  Pt was provided with compression stockings by our office several Office visits ago.  Compressions are for POTS and ehlers danlos.   [facility] is needing a diagnosis code for compressions to be processed and covered through insurance carrier.   Informed the rep at [facility] that appropriate diagnosis codes to use for stockings is G90.A, Q79.60, and R42.  Representative verbalized understanding and agrees with this plan.

## 2022-06-06 ENCOUNTER — Encounter
Payer: Medicaid Other | Attending: Physical Medicine and Rehabilitation | Admitting: Physical Medicine and Rehabilitation

## 2022-06-06 ENCOUNTER — Encounter: Payer: Self-pay | Admitting: Physical Medicine and Rehabilitation

## 2022-06-06 VITALS — BP 107/76 | HR 86 | Ht 63.0 in | Wt 227.2 lb

## 2022-06-06 DIAGNOSIS — G8929 Other chronic pain: Secondary | ICD-10-CM | POA: Diagnosis present

## 2022-06-06 DIAGNOSIS — M545 Low back pain, unspecified: Secondary | ICD-10-CM | POA: Diagnosis present

## 2022-06-06 DIAGNOSIS — G4701 Insomnia due to medical condition: Secondary | ICD-10-CM | POA: Insufficient documentation

## 2022-06-06 MED ORDER — TOPIRAMATE 25 MG PO TABS: 25.0000 mg | ORAL_TABLET | Freq: Every day | ORAL | 3 refills | Status: DC | PRN

## 2022-06-06 NOTE — Patient Instructions (Signed)

## 2022-06-06 NOTE — Progress Notes (Signed)
Subjective:    Patient ID: Alexandra [name], female    DOB: [birth year], 21 y.o.   MRN: [id number]  HPI Alexandra [name] is an 21 year old woman who presents for follow-up of lower back pain.   1) Lower back pain -has been present for years -had a bad flare and went to the hospital, Xrs were obtained and were negative -she cracked something and this helped.  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  -sometimes she has a lot of pain. -at times the pain becomes intolerable -she was prescribed muscle relaxers.  -she tried spinal injection -she has tried ice/heat and lidocaine patches -she has sprays.  -she has never tried a back brace.   2) Insomnia -wakes a lot due to subluxations   Pain Inventory Average Pain 6 Pain Right Now 4 My pain is constant, sharp, dull, stabbing, tingling, aching, and throbbing, numbness  In the last 24 hours, has pain interfered with the following? General activity 10 Relation with others 9 Enjoyment of life 10 What TIME of day is your pain at its worst? morning  and daytime Sleep (in general) Poor  Pain is worse with: walking, bending, sitting, standing, and some activites Pain improves with: medication Relief from Meds: 6  walk with assistance use a cane ability to climb steps?  no do you drive?  no  not employed: date last employed n/a  weakness numbness tingling trouble walking spasms dizziness depression anxiety  Any changes since last visit?  no  Any changes since last visit?  no    Family History  Problem Relation Age of Onset   Hyperthyroidism Mother    Diabetes Mother    Asthma Father    Cataracts Sister    Strabismus Sister    Non-Hodgkin's lymphoma Brother    Cancer Brother    Colon cancer Neg Hx    Stomach cancer Neg Hx    Esophageal cancer Neg Hx    Colon polyps Neg Hx    Social History   Socioeconomic History   Marital status:  Single    Spouse name: Not on file   Number of children: 0   Years of education: Not on file   Highest education level: Not on file  Occupational History   Occupation: Unemployed  Tobacco Use   Smoking status: Never    Passive exposure: Yes   Smokeless tobacco: Never   Tobacco comments:    family smokes outside  Psychologist, educational Use   Vaping Use: Never used  Substance and Sexual Activity   Alcohol use: No   Drug use: No   Sexual activity: Never    Birth control/protection: Abstinence, Pill    Comment: pt. takes birth control pills for cycle regulation  Other Topics Concern   Not on file  Social History Narrative   [employer] 22-23 school - lives with brother, sister, step father and mother.    Social Determinants of Health   Financial Resource Strain: Medium Risk (12/30/2021)   Overall Financial Resource Strain (CARDIA)    Difficulty of Paying Living Expenses: Somewhat hard  Food Insecurity: No Food Insecurity (12/30/2021)   Hunger Vital Sign    Worried About Running Out of Food in the Last Year: Never true    Ran Out of Food in the Last Year: Never true  Transportation Needs: Unmet Transportation Needs (03/06/2022)   PRAPARE - Transportation    Lack  of Transportation (Medical): Yes    Lack of Transportation (Non-Medical): Yes  Physical Activity: Not on file  Stress: Not on file  Social Connections: Not on file   Past Surgical History:  Procedure Laterality Date   DENTAL SURGERY     TYMPANOSTOMY TUBE PLACEMENT     Past Medical History:  Diagnosis Date   Anxiety    Dry skin    Eating disorder    Ehlers-Danlos disease    Fatty liver    POTS (postural orthostatic tachycardia syndrome)    Vision abnormalities    There were no vitals taken for this visit.  Opioid Risk Score:   Fall Risk Score:  `1  Depression screen [facility] 2/9     10/11/2021   10:42 AM 01/20/2020    2:19 PM 11/24/2019    1:59 PM 11/12/2019    4:20 PM 07/21/2019   11:04 AM 06/13/2018   12:33 PM  04/01/2018    2:22 PM  Depression screen PHQ 2/9  Decreased Interest 1 1 0 3 1 3  0  Down, Depressed, Hopeless 1 1 0 3 1 3  0  PHQ - 2 Score 2 2 0 6 2 6  0  Altered sleeping 1 3 0 3 3 3  0  Tired, decreased energy 1 3 0 3 3 3 2   Change in appetite 0 3 3 3 3 3  0  Feeling bad or failure about yourself  1 1 1 3 3  0 0  Trouble concentrating 1 1 3 3 3 3  0  Moving slowly or fidgety/restless 1 1 3 2 3 3  0  Suicidal thoughts 0 0       PHQ-9 Score 7 14 10 23 20 21 2   Difficult doing work/chores Somewhat difficult Very difficult          Review of Systems  Constitutional: Negative.   HENT: Negative.    Eyes: Negative.   Respiratory: Negative.    Cardiovascular: Negative.   Gastrointestinal: Negative.   Endocrine: Negative.   Genitourinary: Negative.   Musculoskeletal:  Positive for arthralgias, back pain and gait problem.  Skin: Negative.   Allergic/Immunologic: Negative.   Neurological:  Positive for dizziness, weakness and numbness.  Hematological: Negative.   Psychiatric/Behavioral:  Positive for dysphoric mood and sleep disturbance. The patient is nervous/anxious.       Objective:   Physical Exam Gen: no distress, normal appearing HEENT: oral mucosa pink and moist, NCAT Cardio: Reg rate Chest: normal effort, normal rate of breathing Abd: soft, non-distended Ext: no edema Psych: pleasant, normal affect Skin: intact Neuro: alert and oriented x3 Musculoskeletal: Normal ambulation. Tender to palpation of lower back. 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  -Discussed benefits of exercise in reducing pain. -prescribed topamax -referred for [facility] -referred to [facility] chiropractor -prescribed low back brace -Discussed following foods that may reduce pain: 1) Ginger (especially studied for arthritis)- reduce leukotriene  production to decrease inflammation 2) Blueberries- high in phytonutrients that decrease inflammation 3) Salmon- marine omega-3s reduce joint swelling and pain 4) Pumpkin seeds- reduce inflammation 5) dark chocolate- reduces inflammation 6) turmeric- reduces inflammation 7) tart cherries - reduce pain and stiffness 8) extra virgin olive oil - its compound olecanthal helps to block prostaglandins  9) chili peppers- can be eaten or applied topically via capsaicin 10) mint- helpful for headache, muscle aches, joint  pain, and itching 11) garlic- reduces inflammation  Link to further information on diet for chronic pain: [email or web address]   2) Insomnia: Insomnia: -Try to go outside near sunrise -Get exercise during the day.  -Turn off all devices an hour before bedtime.  -Teas that can benefit: chamomile, valerian root, Brahmi (Bacopa) -Can consider over the counter melatonin, magnesium, and/or L-theanine. Melatonin is an anti-oxidant with multiple health benefits. Magnesium is involved in greater than 300 enzymatic reactions in the body and most of [place] are deficient as our soil is often depleted. There are 7 different types of magnesium- Bioptemizer's is a supplement with all 7 types, and each has unique benefits. Magnesium can also help with constipation and anxiety.  -Pistachios naturally increase the production of melatonin -[employer] bamboo bed sheets are free from toxic chemicals.  -Tart cherry juice or a tart cherry supplement can improve sleep and soreness post-workout

## 2022-06-07 ENCOUNTER — Other Ambulatory Visit: Payer: Self-pay

## 2022-06-07 ENCOUNTER — Encounter: Payer: Self-pay | Admitting: Physical Therapy

## 2022-06-07 ENCOUNTER — Ambulatory Visit: Payer: Medicaid Other | Attending: Physician Assistant | Admitting: Physical Therapy

## 2022-06-07 DIAGNOSIS — R293 Abnormal posture: Secondary | ICD-10-CM | POA: Diagnosis present

## 2022-06-07 DIAGNOSIS — M5459 Other low back pain: Secondary | ICD-10-CM | POA: Diagnosis present

## 2022-06-07 DIAGNOSIS — G8929 Other chronic pain: Secondary | ICD-10-CM | POA: Insufficient documentation

## 2022-06-07 DIAGNOSIS — M545 Low back pain, unspecified: Secondary | ICD-10-CM | POA: Diagnosis not present

## 2022-06-07 DIAGNOSIS — M6281 Muscle weakness (generalized): Secondary | ICD-10-CM | POA: Insufficient documentation

## 2022-06-07 NOTE — Progress Notes (Signed)
Cardiology Office Note:    Date:  06/09/2022   ID:  Alexandra [name], DOB November 25, 2000, MRN [id number]  PCP:  [facility]   [facility] Providers Cardiologist:  Freada Bergeron, MD {   Referring MD: [facility]     History of Present Illness:    Alexandra [name] is a 21 y.o. female with a hx of anxiety, depression and POTS who presents to clinic for follow-up.  She was initially seen in 08/2021 for evaluation for POTs. Orthostatics revealed postural tachycardia without drop in blood pressure. She was started on metop 12.5mg  BID.  Was seen by Christen Bame where she reported that she felt better on the metop but had difficulty getting her medications due to transportation issues. She was also having atypical chest pain at that time.  Was seen by me on 03/10/22 where she stopped her metop due to n/v. We changed her to propranolol to see if she would tolerate.  Was seen as urgent visit by Ermalinda Barrios on 05/17/22 for chest pain, dyspnea and lightheadedness. She was not using compression socks or salt intake. Her propranolol was increased at that time.  Today, the patient states she has been feeling better on the propranolol 20mg , but she has ran out of the medication.She did not realize she had several refills on her bottle left and could pick them up from the pharmacy. She has also started to wear compression socks which she also thinks helped her symptoms. She continues to have intermittent palpitations but the symptoms improved on propranolol. No syncope.   Past Medical History:  Diagnosis Date   Anxiety    Dry skin    Eating disorder    Ehlers-Danlos disease    Fatty liver    POTS (postural orthostatic tachycardia syndrome)    Vision abnormalities     Past Surgical History:  Procedure Laterality Date   DENTAL SURGERY     TYMPANOSTOMY TUBE PLACEMENT      Current Medications: Current Meds  Medication Sig    FLUoxetine (PROZAC) 20 MG capsule Take 20 mg by mouth daily.   hydrOXYzine (ATARAX) 25 MG tablet Take 25 mg by mouth 3 (three) times daily as needed.   hydrOXYzine (ATARAX) 50 MG tablet Take 1 tablet (50 mg total) by mouth at bedtime.   ondansetron (ZOFRAN-ODT) 8 MG disintegrating tablet Take 1 tablet (8 mg total) by mouth every 8 (eight) hours as needed for nausea or vomiting.   pantoprazole (PROTONIX) 40 MG tablet Take 1 tablet (40 mg total) by mouth daily. 30 minutes before breakfast   Prenatal Vit-Fe Fumarate-FA (PRENATAL VITAMIN PLUS LOW IRON) 27-1 MG TABS Take 1 tablet by mouth daily at 12 noon.   propranolol (INDERAL) 20 MG tablet Take 1 tablet (20 mg total) by mouth 2 (two) times daily.   topiramate (TOPAMAX) 25 MG tablet Take 1 tablet (25 mg total) by mouth daily as needed.     Allergies:   Bee pollen, Methylprednisolone, and Pollen extract   Social History   Socioeconomic History   Marital status: Single    Spouse name: Not on file   Number of children: 0   Years of education: Not on file   Highest education level: Not on file  Occupational History   Occupation: Unemployed  Tobacco Use   Smoking status: Never    Passive exposure: Yes   Smokeless tobacco: Never   Tobacco comments:    family smokes outside  Vaping Use   Vaping Use: Never used  Substance and Sexual Activity   Alcohol use: No   Drug use: No   Sexual activity: Never    Birth control/protection: Abstinence, Pill    Comment: pt. takes birth control pills for cycle regulation  Other Topics Concern   Not on file  Social History Narrative   [employer] 22-23 school - lives with brother, sister, step father and mother.    Social Determinants of Health   Financial Resource Strain: Medium Risk (12/30/2021)   Overall Financial Resource Strain (CARDIA)    Difficulty of Paying Living Expenses: Somewhat hard  Food Insecurity: No Food Insecurity (12/30/2021)   Hunger Vital Sign    Worried About Running  Out of Food in the Last Year: Never true    Ran Out of Food in the Last Year: Never true  Transportation Needs: Unmet Transportation Needs (03/06/2022)   PRAPARE - Hydrologist (Medical): Yes    Lack of Transportation (Non-Medical): Yes  Physical Activity: Not on file  Stress: Not on file  Social Connections: Not on file     Family History: The patient's family history includes Asthma in her father; Cancer in her brother; Cataracts in her sister; Diabetes in her mother; Hyperthyroidism in her mother; Non-Hodgkin's lymphoma in her brother; Strabismus in her sister. There is no history of Colon cancer, Stomach cancer, Esophageal cancer, or Colon polyps.  ROS:   Please see the history of present illness.    Review of Systems  Constitutional:  Negative for malaise/fatigue.  Respiratory:  Negative for cough.   Cardiovascular:  Positive for chest pain and palpitations. Negative for orthopnea, claudication, leg swelling and PND.  Gastrointestinal:  Negative for nausea and vomiting.  Musculoskeletal:  Positive for myalgias. Negative for falls.  Neurological:  Positive for dizziness. Negative for loss of consciousness.  Psychiatric/Behavioral:  Positive for depression. The patient is nervous/anxious.      EKGs/Labs/Other Studies Reviewed:    The following studies were reviewed today: No cardiac studies  EKG:   ECG not performed today  Recent Labs: 04/14/2022: ALT 41; BUN 9; Creatinine, Ser 0.87; Hemoglobin 13.6; Platelets 194; Potassium 4.1; Sodium 137  Recent Lipid Panel    Component Value Date/Time   CHOL 154 02/16/2022 1701   TRIG 200 (H) 02/16/2022 1701   HDL 35 (L) 02/16/2022 1701   CHOLHDL 4.4 02/16/2022 1701   VLDL 17 12/06/2017 0723   LDLCALC 89 02/16/2022 1701          Physical Exam:    VS:  BP (!) 112/58   Pulse 70   Ht 5\' 3"  (1.6 m)   Wt 230 lb (104.3 kg)   SpO2 97%   BMI 40.74 kg/m     Wt Readings from Last 3 Encounters:   06/09/22 230 lb (104.3 kg) (99 %, Z= 2.30)*  06/06/22 227 lb 3.2 oz (103.1 kg) (99 %, Z= 2.28)*  05/28/22 230 lb (104.3 kg) (99 %, Z= 2.30)*   * Growth percentiles are based on CDC (Girls, 2-20 Years) data.     GEN:  Comfortable, NAD HEENT: Normal NECK: No JVD; No carotid bruits CARDIAC: RRR, no murmurs RESPIRATORY:  Clear to auscultation without rales, wheezing or rhonchi  ABDOMEN: Soft, non-tender, non-distended MUSCULOSKELETAL:  No edema; No deformity  SKIN: Warm and dry NEUROLOGIC:  Alert and oriented x 3 PSYCHIATRIC:  Normal affect   ASSESSMENT:    1. POTS (postural orthostatic tachycardia syndrome)  2. Ehlers-Danlos syndrome   3. Anxiety   4. Generalized hypermobility of joints     PLAN:    In order of problems listed above:  #POTS: #Concern for Hypermobile EDS: Patient with orthostatic tachycardia without drop in blood pressure with associated palpitations consistent with POTs. Deemed not to be a candidate for genetic testing due to concern for hypermobile EDS. Did not tolerate metoprolol due to nausea. Now on propranolol with improved symptoms. Assured her that she can go pick up a new bottle from her Pharmacy as she is not out of refills.  -Did not tolerate metoprolol -Continue propranolol 20mg  BID -Continue hydration -Continue salt tabs vs gatorade zero or poweraide zero -Continue compression socks -Graduated exercise  #Anxiety: #Depression: -Management per PCP      Medication Adjustments/Labs and Tests Ordered: Current medicines are reviewed at length with the patient today.  Concerns regarding medicines are outlined above.  No orders of the defined types were placed in this encounter.  No orders of the defined types were placed in this encounter.   Patient Instructions  Medication Instructions:  Your physician recommends that you continue on your current medications as directed. Please refer to the Current Medication list given to you  today.  *If you need a refill on your cardiac medications before your next appointment, please call your pharmacy*   Follow-Up: At [facility], you and your health needs are our priority.  As part of our continuing mission to provide you with exceptional heart care, we have created designated Provider Care Teams.  These Care Teams include your primary Cardiologist (physician) and Advanced Practice Providers (APPs -  Physician Assistants and Nurse Practitioners) who all work together to provide you with the care you need, when you need it.  We recommend signing up for the patient portal called "MyChart".  Sign up information is provided on this After Visit Summary.  MyChart is used to connect with patients for Virtual Visits (Telemedicine).  Patients are able to view lab/test results, encounter notes, upcoming appointments, etc.  Non-urgent messages can be sent to your provider as well.   To learn more about what you can do with MyChart, go to [email or web address].    Your next appointment:   6 month(s)  The format for your next appointment:   In Person  Provider:   Freada Bergeron, MD      Important Information About Sugar        Signed, Freada Bergeron, MD  06/09/2022 1:21 PM    [place]

## 2022-06-07 NOTE — Therapy (Addendum)
OUTPATIENT PHYSICAL THERAPY EVALUATION  DISCHARGE   Patient Name: Alexandra [name] MRN: [id number] DOB:08/18/2001, 21 y.o., female Today's Date: 06/07/2022   PT End of Session - 06/07/22 1319     Visit Number 1    Number of Visits 9    Date for PT Re-Evaluation 08/02/22    Authorization Type MCD    PT Start Time 1216    PT Stop Time 1300    PT Time Calculation (min) 44 min    Activity Tolerance Patient tolerated treatment well    Behavior During Therapy WFL for tasks assessed/performed             Past Medical History:  Diagnosis Date   Anxiety    Dry skin    Eating disorder    Ehlers-Danlos disease    Fatty liver    POTS (postural orthostatic tachycardia syndrome)    Vision abnormalities    Past Surgical History:  Procedure Laterality Date   DENTAL SURGERY     TYMPANOSTOMY TUBE PLACEMENT     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 10/13/2021   Mixed hyperlipidemia 10/13/2021   PTSD (post-traumatic stress disorder) 09/21/2021   Transient alteration of awareness 09/21/2021   Sleep disturbance 08/10/2021   Low back pain 02/02/2021   POTS (postural orthostatic tachycardia syndrome) 01/20/2021   Bulging of lumbar intervertebral disc 12/13/2020   Constipation 12/13/2020   Easy bruising 12/13/2020   Ehlers-Danlos syndrome 12/13/2020   Weight loss 11/16/2020   Chronic bilateral thoracic back pain 11/16/2020   Urinary incontinence without sensory awareness 11/16/2020   Hepatomegaly 06/16/2020   GAD (generalized anxiety disorder) 05/12/2020   Abnormal auditory perception of right ear 05/12/2020   Generalized abdominal pain 06/13/2018   Chronic fatigue 06/13/2018   Generalized hypermobility of joints 06/13/2018   Patellar subluxation 06/13/2018   Joint pain 06/13/2018   Self-injurious behavior 08/09/2017   Gastroesophageal reflux disease 08/09/2017   Attention deficit  hyperactivity disorder (ADHD), combined type 08/09/2017   MDD (major depressive disorder), recurrent severe, without psychosis ([place]) 03/21/2017   Chronic nonintractable headache 01/22/2017   Insomnia 01/11/2017   Dizziness 01/04/2017   Suicidal ideation 12/04/2016   Anorexia nervosa with bulimia 11/13/2016    PCP: [facility]; Freada Bergeron, MD  REFERRING PROVIDER: Persons, Bevely Palmer, PA  REFERRING DIAG: Chronic low back pain, unspecified back pain laterality, unspecified whether sciatica present  Rationale for Evaluation and Treatment Rehabilitation  THERAPY DIAG:  Other low back pain  Muscle weakness (generalized)  Abnormal posture  ONSET DATE: ongoing for many years   SUBJECTIVE:     SUBJECTIVE STATEMENT: Patient reports a lot of back pain, and the most recent time she went to the hospital she was having pain down the left leg and then both legs. She was able to "crack" her back and that helped with the pain. Currently she reports that her back cracks a lot and gets stiff, she is not having any pain down her legs. She also notes having EDS and frequent joint dislocations. She does use a back brace sometimes for the lower back when she is having more pain or when back is feeling not supported.  PERTINENT HISTORY:  EDS with frequent joint dislocations (knees, shoulders, fingers), POTS  PAIN:  Are you having pain? Yes:  NPRS scale: 3-4/10 (6/10 at worst) Pain location: Low back Pain description: Achy, sharp, stiff Aggravating factors:  Sitting in slouched position, standing extended periods Relieving factors: Rest, using back brace  PRECAUTIONS: None  WEIGHT BEARING RESTRICTIONS No  FALLS:  Has patient fallen in last 6 months? No  LIVING ENVIRONMENT: Lives with: lives with their family Lives in: House/apartment  OCCUPATION: states she has to stand a while  PLOF: Independent  PATIENT GOALS: Pain relief and get  stronger   OBJECTIVE:  PATIENT SURVEYS:  Modified Oswestry 30% disability (15/30)   SCREENING FOR RED FLAGS: Negative  COGNITION:  Overall cognitive status: Within functional limits for tasks assessed     SENSATION: WFL  MUSCLE LENGTH: Grossly WFL  POSTURE:  Rounded shoulder and forward head posture, globally slouched, increased lumbar lordosis  PALPATION: Mildly tender to palpation bilateral lumbar paraspinals  LUMBAR ROM:  Lumbar AROM grossly WFL and patient reports no increase in baseline pain level with movement  LOWER EXTREMITY ROM:      Grossly WFL and non-painful  LOWER EXTREMITY MMT:    MMT Right eval Left eval  Hip flexion 4 4  Hip extension 4- 4-  Hip abduction 4- 4-  Knee flexion 5 5  Knee extension 5 5  Ankle dorsiflexion 5 5  Ankle plantarflexion 5 5   LUMBAR SPECIAL TESTS:  Radicular testing negative  FUNCTIONAL TESTS:  DLLT: patient with lumbar extension at approximately 70 deg, reports increased low back pain with testing Lifting: patient exhibits knee extension and increased lumbar flexion with lifting tasks  GAIT: Assistive device utilized: None Level of assistance: Complete Independence Comments: Grossly WFL   TODAY'S TREATMENT  Hooklying posterior pelvic tilt 10 x 5 sec SLR x 10 each - focus on abdominal activation Hooklying unilateral clamshell with red x 10 each Bridge with red at knees x 10  PATIENT EDUCATION:  Education details: Exam findings, POC, HEP Person educated: Patient Education method: Explanation, Demonstration, Tactile cues, Verbal cues, and Handouts Education comprehension: verbalized understanding, returned demonstration, verbal cues required, tactile cues required, and needs further education  HOME EXERCISE PROGRAM: Access Code: [email or web address]   ASSESSMENT: CLINICAL IMPRESSION: Patient is a 21 y.o. female who was seen today for physical therapy evaluation and treatment for chronic low back pain. Patient also  with joint laxity due to EDS likely contributing to her symptoms. She demonstrates generalized proximal muscle weakness and poor lumbopelvic control.  She was provided updated exercise program and she does require cueing for proper abdominal engagement to maintain neutral lumbar spine and avoid pelvic rotation.    OBJECTIVE IMPAIRMENTS decreased activity tolerance, decreased strength, improper body mechanics, postural dysfunction, and pain.   ACTIVITY LIMITATIONS carrying, lifting, sitting, standing, and locomotion level  PARTICIPATION LIMITATIONS: meal prep, cleaning, community activity, and occupation  [place], Past/current experiences, Time since onset of injury/illness/exacerbation, and 3+ comorbidities: see above  are also affecting patient's functional outcome.   REHAB POTENTIAL: Good  CLINICAL DECISION MAKING: Evolving/moderate complexity  EVALUATION COMPLEXITY: Moderate   GOALS: Goals reviewed with patient? Yes  SHORT TERM GOALS: Target date: 07/05/2022  Patient will be I with initial HEP in order to progress with therapy. Baseline: HEP provided at eval Goal status: INITIAL  2.  Patient will report low back pain level with activity </= 3/10 in order to improve ability to stand and lift at work Baseline: 6/10 pain level with activity Goal status: INITIAL  3.  Patient will be able to demonstrate appropriate seated posture and lifting mechanics using hip hinge technique to reduce pain with activity and indicate improved control with functional tasks.  Baseline: patient with rounded shoulder and globally slouched posture, increased lumbar flexion and core control with lifting mechanics Goal status: INITIAL  LONG TERM GOALS: Target date: 08/02/2022  Patient will be I with final HEP to maintain progress from PT. Baseline: HEP provided at eval Goal status: INITIAL  2.  Patient will report Modified ODI </= 15% disability in order to indicate improved functional  ability Baseline: 30% disability Goal status: INITIAL  3.  Patient will perform DLLT to 45 deg prior to lumbar extension and without pain to indicate improved lumbopelvic control and core strength in order improve standing and lifting tolerance Baseline: DDLT to 70 deg and with pain Goal status: INITIAL  4.  Patient will report ability to tolerate standing and sitting >/= 1 hour in order to improve work ability and community access Baseline: patient limited to 30 min with standing and sitting Goal status: INITIAL   PLAN: PT FREQUENCY: 1x/week  PT DURATION: 8 weeks  PLANNED INTERVENTIONS: Therapeutic exercises, Therapeutic activity, Neuromuscular re-education, Balance training, Gait training, Patient/Family education, Self Care, Joint mobilization, Aquatic Therapy, Dry Needling, Electrical stimulation, Cryotherapy, Moist heat, Taping, Manual therapy, and Re-evaluation.  PLAN FOR NEXT SESSION: Review HEP and progress PRN, core stabilization and hip strengthening, postural control and lifting mechanics   Hilda Blades, PT, DPT, LAT, ATC 06/07/22  1:20 PM Phone: [phone] Fax: [phone]   Check all possible CPT codes: 97164 - PT Re-evaluation, 97110- Therapeutic Exercise, [phone]- Neuro Re-education, [phone] - Gait Training, [phone] - Manual Therapy, 97530 - Therapeutic Activities, 97535 - Self Care, 97014 - Electrical stimulation (unattended), and [id number] - Aquatic therapy     If treatment provided at initial evaluation, no treatment charged due to lack of authorization.      PHYSICAL THERAPY DISCHARGE SUMMARY  Visits from Start of Care: 1  Current functional level related to goals / functional outcomes: See above   Remaining deficits: See above   Education / Equipment: HEP   Patient agrees to discharge. Patient goals were not met. Patient is being discharged due to not returning since the last visit.  Hilda Blades, PT, DPT, LAT, ATC 07/19/22  10:04 AM Phone:  [phone] Fax: [phone]

## 2022-06-07 NOTE — Patient Instructions (Signed)
Access Code: [email or web address] URL: [email or web address] Date: 06/07/2022 Prepared by: Hilda Blades  Exercises - Supine Posterior Pelvic Tilt  - 1-2 x daily - 2 sets - 10 reps - 5 seconds hold - Straight Leg Raise  - 1-2 x daily - 2 sets - 10 reps - Hooklying Isometric Clamshell  - 1-2 x daily - 2 sets - 10 reps - Bridge with Resistance  - 1-2 x daily - 2 sets - 10 reps

## 2022-06-09 ENCOUNTER — Encounter: Payer: Self-pay | Admitting: Cardiology

## 2022-06-09 ENCOUNTER — Ambulatory Visit: Payer: Medicaid Other | Attending: Cardiology | Admitting: Cardiology

## 2022-06-09 VITALS — BP 112/58 | HR 70 | Ht 63.0 in | Wt 230.0 lb

## 2022-06-09 DIAGNOSIS — G90A Postural orthostatic tachycardia syndrome (POTS): Secondary | ICD-10-CM | POA: Diagnosis present

## 2022-06-09 DIAGNOSIS — Q796 Ehlers-Danlos syndrome, unspecified: Secondary | ICD-10-CM | POA: Diagnosis present

## 2022-06-09 DIAGNOSIS — M2480 Other specific joint derangements of unspecified joint, not elsewhere classified: Secondary | ICD-10-CM | POA: Diagnosis present

## 2022-06-09 DIAGNOSIS — F419 Anxiety disorder, unspecified: Secondary | ICD-10-CM | POA: Diagnosis present

## 2022-06-09 NOTE — Patient Instructions (Signed)
Medication Instructions:   Your physician recommends that you continue on your current medications as directed. Please refer to the Current Medication list given to you today.  *If you need a refill on your cardiac medications before your next appointment, please call your pharmacy*   Follow-Up: At [facility], you and your health needs are our priority.  As part of our continuing mission to provide you with exceptional heart care, we have created designated Provider Care Teams.  These Care Teams include your primary Cardiologist (physician) and Advanced Practice Providers (APPs -  Physician Assistants and Nurse Practitioners) who all work together to provide you with the care you need, when you need it.  We recommend signing up for the patient portal called "MyChart".  Sign up information is provided on this After Visit Summary.  MyChart is used to connect with patients for Virtual Visits (Telemedicine).  Patients are able to view lab/test results, encounter notes, upcoming appointments, etc.  Non-urgent messages can be sent to your provider as well.   To learn more about what you can do with MyChart, go to [email or web address].    Your next appointment:   6 month(s)  The format for your next appointment:   In Person  Provider:   Heather E Pemberton, MD     Important Information About Sugar       

## 2022-06-11 ENCOUNTER — Encounter: Payer: Self-pay | Admitting: Family

## 2022-06-13 ENCOUNTER — Telehealth: Payer: Self-pay | Admitting: Pediatrics

## 2022-06-13 ENCOUNTER — Other Ambulatory Visit: Payer: Self-pay | Admitting: Family

## 2022-06-13 DIAGNOSIS — F5101 Primary insomnia: Secondary | ICD-10-CM

## 2022-06-13 MED ORDER — HYDROXYZINE HCL 50 MG PO TABS: 50.0000 mg | ORAL_TABLET | Freq: Every day | ORAL | 0 refills | Status: AC

## 2022-06-13 NOTE — Telephone Encounter (Signed)
CALL BACK NUMBER:  [phone]  MEDICATION(S): hydrOXYzine (ATARAX) 50 MG tablet  PREFERRED PHARMACY: [id number] - [place], [place] - [address] AT [address]  ARE YOU CURRENTLY COMPLETELY OUT OF THE MEDICATION? :  yes   Call pt back with details

## 2022-06-15 ENCOUNTER — Ambulatory Visit: Payer: Medicaid Other | Admitting: Physical Therapy

## 2022-06-21 ENCOUNTER — Encounter: Payer: Self-pay | Admitting: Family

## 2022-06-22 ENCOUNTER — Ambulatory Visit: Payer: Medicaid Other | Admitting: Physical Therapy

## 2022-06-23 NOTE — Progress Notes (Unsigned)
  Subjective:    Alexandra [name] - 21 y.o. female MRN [id number]  Date of birth: [birth year]  HPI  Racquelle Hyser is to establish care.   Current issues and/or concerns: Estab w/ Cards postural orthostatic tachycardia syndrome, ehler's danlos syndrome Estab w/ Physical Med back pain/sciatica, insomnia Estab w/ Ortho back pain Estab w/ Gastro constipation, nausea, bloating, weight mgmt, gerd, ibs   Anxiety depression history, would like referral?  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 10/13/2021   Mixed hyperlipidemia 10/13/2021   PTSD (post-traumatic stress disorder) 09/21/2021   Transient alteration of awareness 09/21/2021   Sleep disturbance 08/10/2021   Low back pain 02/02/2021   POTS (postural orthostatic tachycardia syndrome) 01/20/2021   Bulging of lumbar intervertebral disc 12/13/2020   Constipation 12/13/2020   Easy bruising 12/13/2020   Ehlers-Danlos syndrome 12/13/2020   Weight loss 11/16/2020   Chronic bilateral thoracic back pain 11/16/2020   Urinary incontinence without sensory awareness 11/16/2020   Hepatomegaly 06/16/2020   GAD (generalized anxiety disorder) 05/12/2020   Abnormal auditory perception of right ear 05/12/2020   Generalized abdominal pain 06/13/2018   Chronic fatigue 06/13/2018   Generalized hypermobility of joints 06/13/2018   Patellar subluxation 06/13/2018   Joint pain 06/13/2018   Self-injurious behavior 08/09/2017   Gastroesophageal reflux disease 08/09/2017   Attention deficit hyperactivity disorder (ADHD), combined type 08/09/2017   MDD (major depressive disorder),  recurrent severe, without psychosis ([place]) 03/21/2017   Chronic nonintractable headache 01/22/2017   Insomnia 01/11/2017   Dizziness 01/04/2017   Suicidal ideation 12/04/2016   Anorexia nervosa with bulimia 11/13/2016      Social History   reports that she has never smoked. She has been exposed to tobacco smoke. She has never used smokeless tobacco. She reports that she does not drink alcohol and does not use drugs.   Family History  family history includes Asthma in her father; Cancer in her brother; Cataracts in her sister; Diabetes in her mother; Hyperthyroidism in her mother; Non-Hodgkin's lymphoma in her brother; Strabismus in her sister.   Medications: reviewed and updated   Objective:   Physical Exam There were no vitals taken for this visit. Physical Exam      Assessment & Plan:         Patient was given clear instructions to go to Emergency Department or return to medical center if symptoms don't improve, worsen, or new problems develop.The patient verbalized understanding.  I discussed the assessment and treatment plan with the patient. The patient was provided an opportunity to ask questions and all were answered. The patient agreed with the plan and demonstrated an understanding of the instructions.   The patient was advised to call back or seek an in-person evaluation if the symptoms worsen or if the condition fails to improve as anticipated.    Durene Fruits, NP 06/23/2022, 1:09 PM Primary Care at [facility]

## 2022-06-28 ENCOUNTER — Ambulatory Visit: Payer: Medicaid Other | Admitting: Orthopedic Surgery

## 2022-06-28 ENCOUNTER — Encounter: Payer: Self-pay | Admitting: Orthopedic Surgery

## 2022-06-28 ENCOUNTER — Encounter: Payer: Self-pay | Admitting: Family

## 2022-06-28 MED ORDER — PANTOPRAZOLE SODIUM 40 MG PO TBEC: DELAYED_RELEASE_TABLET | ORAL | 2 refills | Status: DC

## 2022-06-29 ENCOUNTER — Encounter: Payer: Medicaid Other | Admitting: Family

## 2022-06-29 ENCOUNTER — Ambulatory Visit: Payer: Medicaid Other | Admitting: Physical Therapy

## 2022-06-29 DIAGNOSIS — Z7689 Persons encountering health services in other specified circumstances: Secondary | ICD-10-CM

## 2022-06-30 ENCOUNTER — Other Ambulatory Visit (HOSPITAL_COMMUNITY): Payer: Self-pay

## 2022-07-04 ENCOUNTER — Telehealth: Payer: Self-pay

## 2022-07-04 ENCOUNTER — Encounter: Payer: Self-pay | Admitting: Physician Assistant

## 2022-07-04 ENCOUNTER — Ambulatory Visit: Payer: Medicaid Other | Attending: Physician Assistant | Admitting: Physician Assistant

## 2022-07-04 ENCOUNTER — Other Ambulatory Visit (HOSPITAL_COMMUNITY): Payer: Self-pay

## 2022-07-04 ENCOUNTER — Telehealth: Payer: Self-pay | Admitting: Cardiology

## 2022-07-04 VITALS — BP 130/62 | HR 75 | Ht 63.0 in | Wt 227.0 lb

## 2022-07-04 DIAGNOSIS — G90A Postural orthostatic tachycardia syndrome (POTS): Secondary | ICD-10-CM | POA: Insufficient documentation

## 2022-07-04 DIAGNOSIS — F332 Major depressive disorder, recurrent severe without psychotic features: Secondary | ICD-10-CM | POA: Diagnosis present

## 2022-07-04 DIAGNOSIS — Q796 Ehlers-Danlos syndrome, unspecified: Secondary | ICD-10-CM | POA: Insufficient documentation

## 2022-07-04 MED ORDER — PROPRANOLOL HCL 40 MG PO TABS: 40.0000 mg | ORAL_TABLET | Freq: Two times a day (BID) | ORAL | 3 refills | Status: DC

## 2022-07-04 NOTE — Telephone Encounter (Signed)
Received call transferred directly from operator and spoke with patient.  She is requesting appointment today for evaluation of palpitations and dizziness.  She is not having symptoms at current time.  Seems to happen more often when she is at work.  No chest pain.  Is taking her medications and wearing compression stockings.  Appointment made for patient to see Ermalinda Barrios, PA today at 10:15.

## 2022-07-04 NOTE — Telephone Encounter (Signed)
Pharmacy Patient Advocate Encounter  Prior Authorization for Pantoprazole has been approved.    PA# [id number] [facility] Plan:[employer] Plan:[id number] Effective dates: 07/04/2022 through 07/04/2023

## 2022-07-04 NOTE — Telephone Encounter (Signed)
Patient c/o Palpitations:  High priority if patient c/o lightheadedness, shortness of breath, or chest pain  How long have you had palpitations/irregular HR/ Afib? Are you having the symptoms now? Rapid heart beat- not at this time  Are you currently experiencing lightheadedness, SOB or CP? Dizziness, also feels like she is going to pass out at times   Do you have a history of afib (atrial fibrillation) or irregular heart rhythm?   Have you checked your BP or HR? (document readings if available):   Are you experiencing any other symptoms?

## 2022-07-04 NOTE — Patient Instructions (Signed)
Medication Instructions:  Your physician has recommended you make the following change in your medication:   INCREASE: Propranolol to 40mg  twice daily  *If you need a refill on your cardiac medications before your next appointment, please call your pharmacy*   Lab Work: None If you have labs (blood work) drawn today and your tests are completely normal, you will receive your results only by: [place] (if you have MyChart) OR A paper copy in the mail If you have any lab test that is abnormal or we need to change your treatment, we will call you to review the results.   Follow-Up: At [facility], you and your health needs are our priority.  As part of our continuing mission to provide you with exceptional heart care, we have created designated Provider Care Teams.  These Care Teams include your primary Cardiologist (physician) and Advanced Practice Providers (APPs -  Physician Assistants and Nurse Practitioners) who all work together to provide you with the care you need, when you need it.  We recommend signing up for the patient portal called "MyChart".  Sign up information is provided on this After Visit Summary.  MyChart is used to connect with patients for Virtual Visits (Telemedicine).  Patients are able to view lab/test results, encounter notes, upcoming appointments, etc.  Non-urgent messages can be sent to your provider as well.   To learn more about what you can do with MyChart, go to [email or web address].    Your next appointment:   1 year(s)  The format for your next appointment:   In Person  Provider:   Freada Bergeron, MD      Important Information About Sugar

## 2022-07-04 NOTE — Progress Notes (Signed)
Cardiology Office Note:    Date:  07/04/2022   ID:  Alexandra [name], DOB [birth year], MRN [id number]  PCP:  [facility]  [place] Providers Cardiologist:  Freada Bergeron, MD     Referring MD: [facility]   Chief Complaint:  Follow-up     History of Present Illness:   Alexandra [name] is a 21 y.o. female with  a hx of anxiety, depression and POTS.   She was initially seen in 08/2021 for evaluation for POTs. Orthostatics revealed postural tachycardia without drop in blood pressure. She was started on metop 12.5mg  BID.    Patient last saw Dr. Johney Frame 03/10/22 and was having palpitations with activity. Metoprolol helped heart rates but she had N/V and lightheadedness so stopped it.  She was given a trial of propanolol, asked to hydrate, salt tabs, compression hose.     I saw the patient 05/17/22 for  palpitations chest pain, dyspnea, lightheadedness for 1-2 weeks. Wasn't doing salt supplementation. She took one of her boyfriend's mother's flecainide. S  Not wearing compression hose.  Lots of stress with biological mother. I increased her propranolol to 20 mg daily.   She saw Dr. Johney Frame 06/09/22 with more symptoms after running out of propanolol.   Patient added onto my schedule for recurrent palpitations and dizziness. 2 weeks ago says her heart races and she gets dizzy and some chest pain. Works as a Magazine features editor at [employer]. Says she wears compression hose at work but not on today. Not doing salt supplement. Drinks occasional caffeine. Taking propanolol everyday. Wants forms filled out for more freq breaks at work and to be able to sit.       Past Medical History:  Diagnosis Date   Anxiety    Dry skin    Eating disorder    Ehlers-Danlos disease    Fatty liver    POTS (postural orthostatic tachycardia syndrome)    Vision abnormalities    Current Medications: Current Meds  Medication Sig   atomoxetine  (STRATTERA) 18 MG capsule Take 18 mg by mouth daily.   FLUoxetine (PROZAC) 40 MG capsule Take 40 mg by mouth daily.   hydrOXYzine (ATARAX) 25 MG tablet Take 25 mg by mouth daily at 6 (six) AM. Pt takes 1 table once a day.   hydrOXYzine (ATARAX) 50 MG tablet Take 1 tablet (50 mg total) by mouth at bedtime.   ondansetron (ZOFRAN-ODT) 8 MG disintegrating tablet Take 1 tablet (8 mg total) by mouth every 8 (eight) hours as needed for nausea or vomiting.   pantoprazole (PROTONIX) 40 MG tablet Take 1 tablet by mouth 30 minutes before breakfast and evening meal   Prenatal Vit-Fe Fumarate-FA (PRENATAL VITAMIN PLUS LOW IRON) 27-1 MG TABS Take 1 tablet by mouth daily at 12 noon.   topiramate (TOPAMAX) 25 MG tablet Take 1 tablet (25 mg total) by mouth daily as needed.   [DISCONTINUED] FLUoxetine (PROZAC) 20 MG capsule Take 20 mg by mouth daily.   [DISCONTINUED] propranolol (INDERAL) 20 MG tablet Take 1 tablet (20 mg total) by mouth 2 (two) times daily.    Allergies:   Bee pollen, Methylprednisolone, and Pollen extract   Social History   Tobacco Use   Smoking status: Never    Passive exposure: Yes   Smokeless tobacco: Never   Tobacco comments:    family smokes outside  Vaping Use   Vaping Use: Never used  Substance Use Topics   Alcohol  use: No   Drug use: No    Family Hx: The patient's family history includes Asthma in her father; Cancer in her brother; Cataracts in her sister; Diabetes in her mother; Hyperthyroidism in her mother; Non-Hodgkin's lymphoma in her brother; Strabismus in her sister. There is no history of Colon cancer, Stomach cancer, Esophageal cancer, or Colon polyps.  ROS     Physical Exam:    VS:  BP 130/62   Pulse 75   Ht 5\' 3"  (1.6 m)   Wt 227 lb (103 kg)   SpO2 99%   BMI 40.21 kg/m     Wt Readings from Last 3 Encounters:  07/04/22 227 lb (103 kg) (99 %, Z= 2.28)*  06/09/22 230 lb (104.3 kg) (99 %, Z= 2.30)*  06/06/22 227 lb 3.2 oz (103.1 kg) (99 %, Z= 2.28)*    * Growth percentiles are based on CDC (Girls, 2-20 Years) data.    Physical Exam  GEN: obese, in no acute distress  HEENT: normal  Neck: no JVD, carotid bruits, or masses Cardiac:RRR; no murmurs, rubs, or gallops  Respiratory:  clear to auscultation bilaterally, normal work of breathing GI: soft, nontender, nondistended, + BS Ext: without cyanosis, clubbing, or edema, Good distal pulses bilaterally Neuro:  Alert and Oriented x 3, Psych: euthymic mood, full affect        EKGs/Labs/Other Test Reviewed:    EKG:  EKG is  not ordered today.     Recent Labs: 04/14/2022: ALT 41; BUN 9; Creatinine, Ser 0.87; Hemoglobin 13.6; Platelets 194; Potassium 4.1; Sodium 137   Recent Lipid Panel Recent Labs    02/16/22 1701  CHOL 154  TRIG 200*  HDL 35*  LDLCALC 89     Prior CV Studies:       Risk Assessment/Calculations/Metrics:              ASSESSMENT & PLAN:   No problem-specific Assessment & Plan notes found for this encounter.    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 Deemed not to be a candidate for genetic testing due to concern for hypermobile EDS. Did not tolerate metoprolol due to nausea. Now on propranolol with 2 weeks of increased  symptoms. increase propranolol 40 mg bid -Did not tolerate metoprolol -Continue hydration -Continue salt tabs vs gatorade zero or poweraide zero -Continue compression socks -Graduated exercise  -filled out work forms to have a stool to sit on and more freq breaks if needed. Anxiety:/Depression: -Management per PCP          Dispo:  No follow-ups on file.   Medication Adjustments/Labs and Tests Ordered: Current medicines are reviewed at length with the patient today.  Concerns regarding medicines are outlined above.  Tests Ordered: No orders of the defined types were placed in this encounter.  Medication  Changes: Meds ordered this encounter  Medications   propranolol (INDERAL) 40 MG tablet    Sig: Take 1 tablet (40 mg total) by mouth 2 (two) times daily.    Dispense:  180 tablet    Refill:  3   Signed, 02/18/22, PA-C  07/04/2022 10:57 AM    [facility] [address] [place], [place], [place]  [place] Phone: [phone]; Fax: [phone]

## 2022-07-05 ENCOUNTER — Encounter: Payer: Self-pay | Admitting: Cardiology

## 2022-07-06 ENCOUNTER — Encounter: Payer: Self-pay | Admitting: Orthopedic Surgery

## 2022-07-06 ENCOUNTER — Ambulatory Visit (INDEPENDENT_AMBULATORY_CARE_PROVIDER_SITE_OTHER): Payer: Medicaid Other | Admitting: Orthopedic Surgery

## 2022-07-06 ENCOUNTER — Ambulatory Visit: Payer: Medicaid Other | Admitting: Physical Therapy

## 2022-07-06 ENCOUNTER — Ambulatory Visit (INDEPENDENT_AMBULATORY_CARE_PROVIDER_SITE_OTHER): Payer: Medicaid Other

## 2022-07-06 VITALS — BP 97/69 | HR 69 | Ht 63.0 in | Wt 227.0 lb

## 2022-07-06 DIAGNOSIS — M545 Low back pain, unspecified: Secondary | ICD-10-CM

## 2022-07-06 DIAGNOSIS — G8929 Other chronic pain: Secondary | ICD-10-CM | POA: Diagnosis not present

## 2022-07-06 NOTE — Progress Notes (Signed)
Orthopedic Spine Surgery Office Note  Assessment: Patient is a 21 y.o. female with chronic axial back pain that has been getting better recently   Plan: -Since her back pain has gotten better recently, I recommended continued observation -I recommended for the long-term health of her back that she lose weight.  I gave her a goal of getting down to a BMI of 25 with the understanding that that will take time to get down to that weight.  She said that her insurance has lapsed and she is unable to do physical therapy.  So I recommended that she do home exercises to work on core strengthening.  Again, that would be to promote long-term health of her back and help her continue to recover through her recent flare of axial low back pain -Patient has tried physical therapy, NSAIDs, Tylenol, oral steroids, steroid injection over the years.  Most recently she has been doing activity modification and over-the-counter pain medications -Patient should return to office on an as-needed basis   Patient expressed understanding of the plan and all questions were answered to the patient's satisfaction.   ___________________________________________________________________________   History:  Patient is a 21 y.o. female who presents today for lumbar spine.  Patient has a chronic history of low back pain.  Recently, she noted acute worsening of her back pain.  Felt like she was unable to extend her back.  She felt stuck in a flexed position.  She said that she then 1 day popped her back and was able to extend again.  Her back pain has been improving since that time.  She does get periodic radiation of pain into her right buttock.  No other radicular type pain.  The pain is not nearly as severe as the back pain and is very episodic in nature.   Weakness: Denies Symptoms of imbalance: Denies Paresthesias and numbness: Denies Bowel or bladder incontinence: Denies Saddle anesthesia: Denies  Treatments tried:  Physical therapy, activity modification NSAIDs, Tylenol, oral steroid, steroid injection over the years.  Most recently she is done over-the-counter pain medication activity modification  Review of systems: Denies fevers and chills, night sweats, unexplained weight loss, history of cancer, pain that wakes them at night  Past medical history: Depression Anxiety Erler Danlos syndrome Irritable bowel syndrome Vertigo Chronic pain POTS syndrome  Allergies: NKDA  Past surgical history:  None  Social history: Denies use of nicotine product (smoking, vaping, patches, smokeless) Alcohol use: Denies Denies recreational drug use   Physical Exam:  General: no acute distress, appears stated age Neurologic: alert, answering questions appropriately, following commands Respiratory: unlabored breathing on room air, symmetric chest rise Psychiatric: appropriate affect, normal cadence to speech   MSK (spine):  -Strength exam      Left  Right EHL    5/5  5/5 TA    5/5  5/5 GSC    5/5  5/5 Knee extension  5/5  5/5 Hip flexion   5/5  5/5  -Sensory exam    Sensation intact to light touch in L3-S1 nerve distributions of bilateral lower extremities  -Achilles DTR: 2/4 on the left, 2/4 on the right -Patellar tendon DTR: 2/4 on the left, 2/4 on the right  -Straight leg raise: Negative -Contralateral straight leg raise: Negative -Clonus: no beats bilaterally  -Left hip exam: No pain to range of motion, negative Stinchfield -Right hip exam: No pain through range of motion, negative stinchfield  Imaging: XR of the lumbar spine from 07/06/2022 and 05/29/2022 was independently  reviewed and interpreted, showing no acute osseous abnormality. No evidence of instability on flexion/extension. No significant degenerative changes.     Patient name: Alexandra [name] Patient MRN: [id number] Date of visit: 07/06/22

## 2022-07-18 ENCOUNTER — Encounter: Payer: Self-pay | Admitting: Family

## 2022-07-18 ENCOUNTER — Ambulatory Visit (INDEPENDENT_AMBULATORY_CARE_PROVIDER_SITE_OTHER): Payer: Medicaid Other | Admitting: Family

## 2022-07-18 VITALS — BP 105/68 | HR 81 | Ht 63.25 in | Wt 227.2 lb

## 2022-07-18 DIAGNOSIS — Z3046 Encounter for surveillance of implantable subdermal contraceptive: Secondary | ICD-10-CM

## 2022-07-18 DIAGNOSIS — Z975 Presence of (intrauterine) contraceptive device: Secondary | ICD-10-CM | POA: Diagnosis not present

## 2022-07-18 DIAGNOSIS — N921 Excessive and frequent menstruation with irregular cycle: Secondary | ICD-10-CM

## 2022-07-18 MED ORDER — ELLA 30 MG PO TABS: 1.0000 | ORAL_TABLET | Freq: Once | ORAL | 0 refills | Status: AC

## 2022-07-18 NOTE — Progress Notes (Unsigned)
History was provided by the {[email or web address]}.  Alexandra [name] is a 21 y.o. female who is here for ***.   PCP confirmed? 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 10/13/2021   Mixed hyperlipidemia 10/13/2021   PTSD (post-traumatic stress disorder) 09/21/2021   Transient alteration of awareness 09/21/2021   Sleep disturbance 08/10/2021   Low back pain 02/02/2021   POTS (postural orthostatic tachycardia syndrome) 01/20/2021   Bulging of lumbar intervertebral disc 12/13/2020   Constipation 12/13/2020   Easy bruising 12/13/2020   Ehlers-Danlos syndrome 12/13/2020   Weight loss 11/16/2020   Chronic bilateral thoracic back pain 11/16/2020   Urinary incontinence without sensory awareness 11/16/2020   Hepatomegaly 06/16/2020   GAD (generalized anxiety disorder) 05/12/2020   Abnormal auditory perception of right ear 05/12/2020   Generalized abdominal pain 06/13/2018   Chronic fatigue 06/13/2018   Generalized hypermobility of joints 06/13/2018   Patellar subluxation 06/13/2018   Joint pain 06/13/2018   Self-injurious behavior 08/09/2017   Gastroesophageal reflux disease 08/09/2017   Attention deficit hyperactivity disorder (ADHD), combined type 08/09/2017   MDD (major depressive disorder), recurrent severe, without psychosis (HCC) 03/21/2017   Chronic nonintractable headache 01/22/2017   Insomnia 01/11/2017   Dizziness 01/04/2017   Suicidal ideation 12/04/2016   Anorexia nervosa with bulimia 11/13/2016    Current Outpatient Medications on File Prior to Visit  Medication Sig Dispense Refill   atomoxetine (STRATTERA) 18 MG capsule Take  18 mg by mouth daily.     FLUoxetine (PROZAC) 40 MG capsule Take 40 mg by mouth daily.     hydrOXYzine (ATARAX) 25 MG tablet Take 25 mg by mouth daily at 6 (six) AM. Pt takes 1 table once a day.     hydrOXYzine (ATARAX) 50 MG tablet Take 1 tablet (50 mg total) by mouth at bedtime. 30 tablet 0   ondansetron (ZOFRAN-ODT) 8 MG disintegrating tablet Take 1 tablet (8 mg total) by mouth every 8 (eight) hours as needed for nausea or vomiting. 10 tablet 0   pantoprazole (PROTONIX) 40 MG tablet Take 1 tablet by mouth 30 minutes before breakfast and evening meal 60 tablet 2   Prenatal Vit-Fe Fumarate-FA (PRENATAL VITAMIN PLUS LOW IRON) 27-1 MG TABS Take 1 tablet by mouth daily at 12 noon. 90 tablet 3   propranolol (INDERAL) 40 MG tablet Take 1 tablet (40 mg total) by mouth 2 (two) times daily. 180 tablet 3   topiramate (TOPAMAX) 25 MG tablet Take 1 tablet (25 mg total) by mouth daily as needed. 90 tablet 3   No current facility-administered medications on file prior to visit.    Allergies  Allergen Reactions   Bee Pollen Other (See Comments)    "seasonal allergies"   Methylprednisolone Anxiety and Other (See Comments)    Became angry and had a "weird feeling"   Pollen Extract     "seasonal allergies"    Physical Exam:    Vitals:   07/18/22 1417  BP: 105/68  Pulse: 81  Weight: 227 lb 3.2 oz (103.1 kg)  Height: 5' 3.25" (1.607 m)   Wt Readings from Last 3 Encounters:  07/18/22  227 lb 3.2 oz (103.1 kg) (99 %, Z= 2.28)*  07/06/22 227 lb (103 kg) (99 %, Z= 2.28)*  07/04/22 227 lb (103 kg) (99 %, Z= 2.28)*   * Growth percentiles are based on CDC (Girls, 2-20 Years) data.     Blood pressure %iles are not available for patients who are 18 years or older. No LMP recorded.  Physical Exam   Assessment/Plan: ***

## 2022-07-20 ENCOUNTER — Encounter: Payer: Self-pay | Admitting: *Deleted

## 2022-07-20 ENCOUNTER — Ambulatory Visit: Payer: Medicaid Other | Admitting: Family

## 2022-07-20 ENCOUNTER — Encounter: Payer: Self-pay | Admitting: Family

## 2022-07-30 ENCOUNTER — Encounter: Payer: Self-pay | Admitting: Family

## 2022-08-02 ENCOUNTER — Ambulatory Visit: Payer: Medicaid Other | Admitting: Nurse Practitioner

## 2022-08-03 ENCOUNTER — Encounter: Payer: Self-pay | Admitting: Orthopedic Surgery

## 2022-08-03 ENCOUNTER — Encounter: Payer: Self-pay | Admitting: Physical Therapy

## 2022-08-03 DIAGNOSIS — G8929 Other chronic pain: Secondary | ICD-10-CM

## 2022-08-04 MED ORDER — LIDOCAINE 5 % EX PTCH: 1.0000 | MEDICATED_PATCH | CUTANEOUS | 0 refills | Status: DC

## 2022-08-07 ENCOUNTER — Encounter: Payer: Self-pay | Admitting: Family

## 2022-08-09 ENCOUNTER — Encounter: Payer: Self-pay | Admitting: Family

## 2022-08-09 ENCOUNTER — Other Ambulatory Visit: Payer: Self-pay | Admitting: Family

## 2022-08-09 MED ORDER — ONDANSETRON 8 MG PO TBDP: 8.0000 mg | ORAL_TABLET | Freq: Three times a day (TID) | ORAL | 0 refills | Status: DC | PRN

## 2022-08-12 ENCOUNTER — Emergency Department (HOSPITAL_COMMUNITY)
Admission: EM | Admit: 2022-08-12 | Discharge: 2022-08-12 | Discharge status: Against Medical Advice | Payer: Medicaid Other | Attending: Emergency Medicine | Admitting: Emergency Medicine

## 2022-08-12 ENCOUNTER — Encounter (HOSPITAL_COMMUNITY): Payer: Self-pay

## 2022-08-12 ENCOUNTER — Ambulatory Visit
Admission: EM | Admit: 2022-08-12 | Discharge: 2022-08-12 | Disposition: A | Discharge status: Home / Self Care | Payer: Medicaid Other | Attending: Physician Assistant | Admitting: Physician Assistant

## 2022-08-12 DIAGNOSIS — Z20822 Contact with and (suspected) exposure to covid-19: Secondary | ICD-10-CM | POA: Diagnosis not present

## 2022-08-12 DIAGNOSIS — R197 Diarrhea, unspecified: Secondary | ICD-10-CM

## 2022-08-12 DIAGNOSIS — H66001 Acute suppurative otitis media without spontaneous rupture of ear drum, right ear: Secondary | ICD-10-CM

## 2022-08-12 DIAGNOSIS — J069 Acute upper respiratory infection, unspecified: Secondary | ICD-10-CM | POA: Diagnosis not present

## 2022-08-12 DIAGNOSIS — R109 Unspecified abdominal pain: Secondary | ICD-10-CM | POA: Diagnosis not present

## 2022-08-12 DIAGNOSIS — Z5321 Procedure and treatment not carried out due to patient leaving prior to being seen by health care provider: Secondary | ICD-10-CM | POA: Diagnosis not present

## 2022-08-12 DIAGNOSIS — R112 Nausea with vomiting, unspecified: Secondary | ICD-10-CM | POA: Insufficient documentation

## 2022-08-12 DIAGNOSIS — J029 Acute pharyngitis, unspecified: Secondary | ICD-10-CM | POA: Insufficient documentation

## 2022-08-12 LAB — RESP PANEL BY RT-PCR (RSV, FLU A&B, COVID)  RVPGX2
Influenza A by PCR: NEGATIVE
Influenza B by PCR: NEGATIVE
Resp Syncytial Virus by PCR: NEGATIVE
SARS Coronavirus 2 by RT PCR: NEGATIVE

## 2022-08-12 LAB — URINALYSIS, ROUTINE W REFLEX MICROSCOPIC
Bacteria, UA: NONE SEEN
Bilirubin Urine: NEGATIVE
Glucose, UA: NEGATIVE mg/dL
Hgb urine dipstick: NEGATIVE
Ketones, ur: NEGATIVE mg/dL
Nitrite: NEGATIVE
Protein, ur: NEGATIVE mg/dL
Specific Gravity, Urine: 1.019 (ref 1.005–1.030)
pH: 5 (ref 5.0–8.0)

## 2022-08-12 LAB — CBC WITH DIFFERENTIAL/PLATELET
Abs Immature Granulocytes: 0.04 10*3/uL (ref 0.00–0.07)
Basophils Absolute: 0 10*3/uL (ref 0.0–0.1)
Basophils Relative: 0 %
Eosinophils Absolute: 0.2 10*3/uL (ref 0.0–0.5)
Eosinophils Relative: 2 %
HCT: 44.1 % (ref 36.0–46.0)
Hemoglobin: 14.6 g/dL (ref 12.0–15.0)
Immature Granulocytes: 0 %
Lymphocytes Relative: 27 %
Lymphs Abs: 2.8 10*3/uL (ref 0.7–4.0)
MCH: 26.5 pg (ref 26.0–34.0)
MCHC: 33.1 g/dL (ref 30.0–36.0)
MCV: 80.2 fL (ref 80.0–100.0)
Monocytes Absolute: 0.7 10*3/uL (ref 0.1–1.0)
Monocytes Relative: 6 %
Neutro Abs: 6.7 10*3/uL (ref 1.7–7.7)
Neutrophils Relative %: 65 %
Platelets: 255 10*3/uL (ref 150–400)
RBC: 5.5 MIL/uL — ABNORMAL HIGH (ref 3.87–5.11)
RDW: 13.6 % (ref 11.5–15.5)
WBC: 10.5 10*3/uL (ref 4.0–10.5)
nRBC: 0 % (ref 0.0–0.2)

## 2022-08-12 LAB — COMPREHENSIVE METABOLIC PANEL
ALT: 91 U/L — ABNORMAL HIGH (ref 0–44)
AST: 40 U/L (ref 15–41)
Albumin: 3.9 g/dL (ref 3.5–5.0)
Alkaline Phosphatase: 98 U/L (ref 38–126)
Anion gap: 8 (ref 5–15)
BUN: 13 mg/dL (ref 6–20)
CO2: 21 mmol/L — ABNORMAL LOW (ref 22–32)
Calcium: 9.3 mg/dL (ref 8.9–10.3)
Chloride: 110 mmol/L (ref 98–111)
Creatinine, Ser: 0.84 mg/dL (ref 0.44–1.00)
GFR, Estimated: >60 mL/min (ref >60)
Glucose, Bld: 90 mg/dL (ref 70–99)
Potassium: 4.3 mmol/L (ref 3.5–5.1)
Sodium: 139 mmol/L (ref 135–145)
Total Bilirubin: 0.6 mg/dL (ref 0.3–1.2)
Total Protein: 7.9 g/dL (ref 6.5–8.1)

## 2022-08-12 LAB — LIPASE, BLOOD: Lipase: 40 U/L (ref 11–51)

## 2022-08-12 MED ORDER — AMOXICILLIN-POT CLAVULANATE 875-125 MG PO TABS: 1.0000 | ORAL_TABLET | Freq: Two times a day (BID) | ORAL | 0 refills | Status: DC

## 2022-08-12 NOTE — ED Provider Triage Note (Signed)
Emergency Medicine Provider Triage Evaluation Note  Alexandra [name] , a 21 y.o. female  was evaluated in triage.  Pt complains of nausea and vomiting since earlier this week. LMP around 11/18.  Did have a pregnancy test at home yesterday which was negative.  Also endorsing sore throat, body aches, her last oral intake was last night consistent with soup.  No urinary symptoms.  No fevers at home.  Review of Systems  Positive: Abdominal pain, nausea, vomiting Negative: Fever, headache, urinary symptoms  Physical Exam  BP 109/65 (BP Location: Left Arm)   Pulse 86   Temp 99 F (37.2 C) (Oral)   Resp 16   LMP 07/22/2022 (Approximate)   SpO2 99%  Gen:   Awake, no distress   Resp:  Normal effort  MSK:   Moves extremities without difficulty  Other:    Medical Decision Making  Medically screening exam initiated at 1:17 PM.  Appropriate orders placed.  Alexandra [name] was informed that the remainder of the evaluation will be completed by another provider, this initial triage assessment does not replace that evaluation, and the importance of remaining in the ED until their evaluation is complete.     Alexandra Manges, PA-C 08/12/22 1319

## 2022-08-12 NOTE — ED Provider Notes (Signed)
[facility]    CSN: [id number] Arrival date & time: 08/12/22  1358      History   Chief Complaint Chief Complaint  Patient presents with   Cough    HPI Alexandra [name] is a 21 y.o. female.   Here concerned with cough x 1 week.  Admits nasal congestion, rhinorrhea, sore throat, n/v/d. She reports no vomiting last 2 days and no diarrhea last 2 days.  She states she's taking nausea medication and pepto.  No sick contacts.    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 10/13/2021   Mixed hyperlipidemia 10/13/2021   PTSD (post-traumatic stress disorder) 09/21/2021   Transient alteration of awareness 09/21/2021   Sleep disturbance 08/10/2021   Low back pain 02/02/2021   POTS (postural orthostatic tachycardia syndrome) 01/20/2021   Bulging of lumbar intervertebral disc 12/13/2020   Constipation 12/13/2020   Easy bruising 12/13/2020   Ehlers-Danlos syndrome 12/13/2020   Weight loss 11/16/2020   Chronic bilateral thoracic back pain 11/16/2020   Urinary incontinence without sensory awareness 11/16/2020   Hepatomegaly 06/16/2020   GAD (generalized anxiety disorder) 05/12/2020   Abnormal auditory perception of right ear 05/12/2020   Generalized abdominal pain 06/13/2018   Chronic fatigue 06/13/2018   Generalized hypermobility of joints 06/13/2018   Patellar subluxation 06/13/2018   Joint pain 06/13/2018   Self-injurious behavior 08/09/2017   Gastroesophageal reflux disease 08/09/2017   Attention deficit hyperactivity disorder (ADHD), combined type 08/09/2017   MDD (major depressive disorder), recurrent severe, without psychosis ([place]) 03/21/2017   Chronic nonintractable headache  01/22/2017   Insomnia 01/11/2017   Dizziness 01/04/2017   Suicidal ideation 12/04/2016   Anorexia nervosa with bulimia 11/13/2016    Past Surgical History:  Procedure Laterality Date   DENTAL SURGERY     TYMPANOSTOMY TUBE PLACEMENT      OB History   No obstetric history on file.      Home Medications    Prior to Admission medications   Medication Sig Start Date End Date Taking? Authorizing Provider  amoxicillin-clavulanate (AUGMENTIN) 875-125 MG tablet Take 1 tablet by mouth every 12 (twelve) hours. 08/12/22  Yes Peri Jefferson, PA-C  atomoxetine (STRATTERA) 18 MG capsule Take 18 mg by mouth daily. 06/13/22   [provider]  FLUoxetine (PROZAC) 40 MG capsule Take 40 mg by mouth daily. 06/13/22   [provider]  hydrOXYzine (ATARAX) 25 MG tablet Take 25 mg by mouth daily at 6 (six) AM. Pt takes 1 table once a day.    [provider]  hydrOXYzine (ATARAX) 50 MG tablet Take 1 tablet (50 mg total) by mouth at bedtime. 06/13/22   Parthenia Ames, NP  lidocaine (LIDODERM) 5 % Place 1 patch onto the skin daily. Remove & Discard patch within 12 hours or as directed by MD 08/04/22   Callie Fielding, MD  ondansetron (ZOFRAN-ODT) 8 MG disintegrating tablet Take 1 tablet (8 mg total) by mouth every 8 (eight) hours as needed for nausea or vomiting. 08/09/22   Parthenia Ames, NP  pantoprazole (PROTONIX) 40 MG tablet Take 1 tablet by mouth 30 minutes before breakfast and evening meal 06/28/22  Levin Erp, [place]  Prenatal Vit-Fe Fumarate-FA (PRENATAL VITAMIN PLUS LOW IRON) 27-1 MG TABS Take 1 tablet by mouth daily at 12 noon. 10/13/21   Trude Mcburney, FNP  propranolol (INDERAL) 40 MG tablet Take 1 tablet (40 mg total) by mouth 2 (two) times daily. 07/04/22   Imogene Burn, PA-C  topiramate (TOPAMAX) 25 MG tablet Take 1 tablet (25 mg total) by mouth daily as needed. 06/06/22   Raulkar, Clide Deutscher, MD    Family History Family History  Problem Relation  Age of Onset   Hyperthyroidism Mother    Diabetes Mother    Asthma Father    Cataracts Sister    Strabismus Sister    Non-Hodgkin's lymphoma Brother    Cancer Brother    Colon cancer Neg Hx    Stomach cancer Neg Hx    Esophageal cancer Neg Hx    Colon polyps Neg Hx     Social History Social History   Tobacco Use   Smoking status: Never    Passive exposure: Yes   Smokeless tobacco: Never   Tobacco comments:    family smokes outside  Vaping Use   Vaping Use: Never used  Substance Use Topics   Alcohol use: No   Drug use: No     Allergies   Bee pollen, Methylprednisolone, and Pollen extract   Review of Systems Review of Systems  Constitutional:  Negative for chills, fatigue and fever.  HENT:  Positive for congestion, ear pain, rhinorrhea and sore throat. Negative for nosebleeds, postnasal drip, sinus pressure and sinus pain.   Eyes:  Negative for pain and redness.  Respiratory:  Positive for cough. Negative for shortness of breath and wheezing.   Gastrointestinal:  Positive for diarrhea, nausea and vomiting. Negative for abdominal pain.  Musculoskeletal:  Negative for arthralgias and myalgias.  Skin:  Negative for rash.  Neurological:  Negative for light-headedness and headaches.  Hematological:  Negative for adenopathy. Does not bruise/bleed easily.  Psychiatric/Behavioral:  Negative for confusion and sleep disturbance.      Physical Exam Triage Vital Signs ED Triage Vitals [08/12/22 1500]  Enc Vitals Group     BP 139/71     Pulse Rate 71     Resp 20     Temp 98.6 F (37 C)     Temp src      SpO2 98 %     Weight      Height      Head Circumference      Peak Flow      Pain Score 0     Pain Loc      Pain Edu?      Excl. in [place]?    No data found.  Updated Vital Signs BP 139/71 (BP Location: Left Arm)   Pulse 71   Temp 98.6 F (37 C)   Resp 20   LMP 07/22/2022 (Approximate)   SpO2 98%   Visual Acuity Right Eye Distance:   Left Eye Distance:    Bilateral Distance:    Right Eye Near:   Left Eye Near:    Bilateral Near:     Physical Exam Vitals and nursing note reviewed.  Constitutional:      General: She is not in acute distress.    Appearance: Normal appearance. She is well-developed. She is obese. She is not ill-appearing or toxic-appearing.  HENT:     Head: Normocephalic and atraumatic.     Right Ear: Ear canal normal. No swelling  or tenderness. No middle ear effusion. Tympanic membrane is erythematous and bulging. Tympanic membrane is not injected, scarred, perforated or retracted.     Left Ear: Tympanic membrane and ear canal normal. No swelling or tenderness.  No middle ear effusion. Tympanic membrane is not injected, scarred, perforated, retracted or bulging.     Nose: Mucosal edema, congestion and rhinorrhea present. Rhinorrhea is clear.     Right Turbinates: Swollen. Not enlarged.     Left Turbinates: Swollen. Not enlarged.     Right Sinus: No frontal sinus tenderness.     Left Sinus: No maxillary sinus tenderness or frontal sinus tenderness.     Mouth/Throat:     Mouth: Mucous membranes are moist.     Pharynx: Oropharynx is clear. No pharyngeal swelling, oropharyngeal exudate, posterior oropharyngeal erythema or uvula swelling.     Tonsils: No tonsillar exudate or tonsillar abscesses. 0 on the right. 0 on the left.  Eyes:     Extraocular Movements: Extraocular movements intact.     Conjunctiva/sclera: Conjunctivae normal.  Cardiovascular:     Rate and Rhythm: Normal rate and regular rhythm.     Heart sounds: No murmur heard. Pulmonary:     Effort: Pulmonary effort is normal. No respiratory distress.     Breath sounds: Normal breath sounds. No wheezing, rhonchi or rales.  Abdominal:     General: There is no distension.     Tenderness: There is no abdominal tenderness. There is no right CVA tenderness, left CVA tenderness or guarding.  Musculoskeletal:        General: Normal range of motion.     Cervical back:  Neck supple.  Skin:    General: Skin is warm and dry.     Capillary Refill: Capillary refill takes less than 2 seconds.  Neurological:     General: No focal deficit present.     Mental Status: She is alert and oriented to person, place, and time.     Motor: No weakness.     Gait: Gait normal.  Psychiatric:        Mood and Affect: Mood normal.        Behavior: Behavior normal.      UC Treatments / Results  Labs (all labs ordered are listed, but only abnormal results are displayed) Labs Reviewed - No data to display  EKG   Radiology No results found.  Procedures Procedures (including critical care time)  Medications Ordered in UC Medications - No data to display  Initial Impression / Assessment and Plan / UC Course  I have reviewed the triage vital signs and the nursing notes.  Pertinent labs & imaging results that were available during my care of the patient were reviewed by me and considered in my medical decision making (see chart for details).     Will treat ear infection Otherwise viral URI  Take medication as prescribed Follow up with PCP Patient request note through Tuesday, she is aware we cannot fill out FMLA paperwork. Final Clinical Impressions(s) / UC Diagnoses   Final diagnoses:  Viral upper respiratory tract infection  Non-recurrent acute suppurative otitis media of right ear without spontaneous rupture of tympanic membrane  Diarrhea, unspecified type     Discharge Instructions      Take medication as prescribed Follow up with PCP if diarrhea and vomiting persist Patient aware we cannot fill out FMLA paperwork    ED Prescriptions     Medication Sig Dispense Auth. Provider   amoxicillin-clavulanate (AUGMENTIN) 875-125 MG  tablet Take 1 tablet by mouth every 12 (twelve) hours. 14 tablet Evern Core, PA-C      PDMP not reviewed this encounter.   Evern Core, PA-C 08/12/22 1611

## 2022-08-12 NOTE — ED Triage Notes (Signed)
Pt presents with c/o vomiting and diarrhea since earlier this week. Pt also reports some pain in her abdomen as well as some coughing and a sore throat.

## 2022-08-12 NOTE — ED Triage Notes (Signed)
Pt c/o cough, sore throat, nose bleeds, n/v/d x 1 week-NAD-steady gaity

## 2022-08-12 NOTE — Discharge Instructions (Addendum)
Take medication as prescribed Follow up with PCP if diarrhea and vomiting persist Patient aware we cannot fill out FMLA paperwork

## 2022-08-14 ENCOUNTER — Other Ambulatory Visit: Payer: Self-pay | Admitting: Cardiology

## 2022-08-14 DIAGNOSIS — G90A Postural orthostatic tachycardia syndrome (POTS): Secondary | ICD-10-CM

## 2022-08-14 DIAGNOSIS — M2480 Other specific joint derangements of unspecified joint, not elsewhere classified: Secondary | ICD-10-CM

## 2022-08-17 ENCOUNTER — Encounter: Payer: Self-pay | Admitting: Physician Assistant

## 2022-08-17 ENCOUNTER — Ambulatory Visit (INDEPENDENT_AMBULATORY_CARE_PROVIDER_SITE_OTHER): Payer: Medicaid Other | Admitting: Physician Assistant

## 2022-08-17 VITALS — BP 110/68 | HR 79 | Ht 64.0 in | Wt 229.5 lb

## 2022-08-17 DIAGNOSIS — K5900 Constipation, unspecified: Secondary | ICD-10-CM

## 2022-08-17 DIAGNOSIS — R140 Abdominal distension (gaseous): Secondary | ICD-10-CM

## 2022-08-17 DIAGNOSIS — R112 Nausea with vomiting, unspecified: Secondary | ICD-10-CM

## 2022-08-17 DIAGNOSIS — R1084 Generalized abdominal pain: Secondary | ICD-10-CM

## 2022-08-17 NOTE — Patient Instructions (Addendum)
Please purchase the following medications over the counter and take as directed:  Start Miralax one cap full daily  You have been schedule for follow up visit 09/21/22 at 2:00 pm  If you are age 19 or older, your body mass index should be between 23-30. Your Body mass index is 39.39 kg/m. If this is out of the aforementioned range listed, please consider follow up with your Primary Care Provider.  If you are age 81 or younger, your body mass index should be between 19-25. Your Body mass index is 39.39 kg/m. If this is out of the aformentioned range listed, please consider follow up with your Primary Care Provider.   ________________________________________________________  The [place] [facility] providers would like to encourage you to use [facility] to communicate with providers for non-urgent requests or questions.  Due to long hold times on the telephone, sending your provider a message by [facility] may be a faster and more efficient way to get a response.  Please allow 48 business hours for a response.  Please remember that this is for non-urgent requests.   You have been scheduled for an abdominal ultrasound at [facility] (1st floor of hospital) on 08/25/22 at 10:00 am. Please arrive at 9:45 am prior to your appointment for registration. Make certain not to have anything to eat at midnight prior to your appointment. Should you need to reschedule your appointment, please contact radiology at [phone]. This test typically takes about 30 minutes to perform.   Due to recent changes in healthcare laws, you may see the results of your imaging and laboratory studies on MyChart before your provider has had a chance to review them.  We understand that in some cases there may be results that are confusing or concerning to you. Not all laboratory results come back in the same time frame and the provider may be waiting for multiple results in order to interpret others.  Please give [place] 48 hours in order  for your provider to thoroughly review all the results before contacting the office for clarification of your results.    Thank you for entrusting me with your care and choosing [facility].  Hyacinth Meeker PA-C

## 2022-08-17 NOTE — Progress Notes (Signed)
Chief Complaint: Follow-up abdominal pain and bloating  HPI:    [name] is an 21 year old Hispanic female, assigned to Dr. Chales Abrahams, with a past medical history as listed below including Erler's Danlos, who presents to clinic today for follow-up of abdominal pain and bloating.    04/14/2022 patient initially seen in the urgent care for generalized abdominal pain and sent to the ER. Described mid diffuse abdominal pain for 2 to 3 weeks. The symptoms were constant. At that time labs showed normal CMP, CBC and lipase. Patient had a CT of the abdomen pelvis which showed shotty subcentimeter mesenteric lymph nodes which were nonspecific but could be seen with mesenteric adenitis.     04/20/2022 patient seen in clinic by that me and discussed being diagnosed with IBS a few years ago she had had troubles with radiation of stools.  At that time recommend that she take MiraLAX on a daily basis, discussed hopefully when she is having a regular stool she would have decreased left lower quadrant pain and nausea.  She had asked about Desipramine and recommended that she discuss this with her primary care physician.  She asked for referral to a dietitian for weight loss and IBS.    06/26/2022 patient describes some stomach pain and nausea.  At that time was recommended she increase her Pantoprazole to twice daily 40 mg before breakfast and dinner.    Today, patient presents to clinic and tells me she thought the Pantoprazole was helping for a while but then about 3 to 4 weeks ago she started with another episode of nausea and some vomiting with epigastric pain and a lot of bloating.  She was told to increase her Pantoprazole to twice a day and she did this for about a week but it made no difference.  She was finally given Zofran by the urgent care and this seemed to help her symptoms some, she has no further vomiting but continues with some pain across the top of her abdomen and a lot of bloating which is  uncomfortable for her.  Apparently tried MiraLAX capsules but was not taking them daily, when she does take them and helps her to have a bowel movement.    Denies fever, chills, weight loss or blood in her stool.  Past Medical History:  Diagnosis Date   Anxiety    Dry skin    Eating disorder    Ehlers-Danlos disease    Fatty liver    POTS (postural orthostatic tachycardia syndrome)    Vision abnormalities     Past Surgical History:  Procedure Laterality Date   DENTAL SURGERY     TYMPANOSTOMY TUBE PLACEMENT      Current Outpatient Medications  Medication Sig Dispense Refill   amoxicillin-clavulanate (AUGMENTIN) 875-125 MG tablet Take 1 tablet by mouth every 12 (twelve) hours. 14 tablet 0   atomoxetine (STRATTERA) 18 MG capsule Take 18 mg by mouth daily.     FLUoxetine (PROZAC) 40 MG capsule Take 40 mg by mouth daily.     hydrOXYzine (ATARAX) 25 MG tablet Take 25 mg by mouth daily at 6 (six) AM. Pt takes 1 table once a day.     hydrOXYzine (ATARAX) 50 MG tablet Take 1 tablet (50 mg total) by mouth at bedtime. 30 tablet 0   lidocaine (LIDODERM) 5 % Place 1 patch onto the skin daily. Remove & Discard patch within 12 hours or as directed by MD 30 patch 0   ondansetron (ZOFRAN-ODT) 8 MG disintegrating tablet  Take 1 tablet (8 mg total) by mouth every 8 (eight) hours as needed for nausea or vomiting. 10 tablet 0   pantoprazole (PROTONIX) 40 MG tablet Take 1 tablet by mouth 30 minutes before breakfast and evening meal 60 tablet 2   Prenatal Vit-Fe Fumarate-FA (PRENATAL VITAMIN PLUS LOW IRON) 27-1 MG TABS Take 1 tablet by mouth daily at 12 noon. 90 tablet 3   propranolol (INDERAL) 40 MG tablet Take 1 tablet (40 mg total) by mouth 2 (two) times daily. 180 tablet 3   topiramate (TOPAMAX) 25 MG tablet Take 1 tablet (25 mg total) by mouth daily as needed. 90 tablet 3   No current facility-administered medications for this visit.    Allergies as of 08/17/2022 - Review Complete 08/12/2022   Allergen Reaction Noted   Bee pollen Other (See Comments) 03/21/2017   Methylprednisolone Anxiety and Other (See Comments) 06/06/2022   Pollen extract  03/21/2017    Family History  Problem Relation Age of Onset   Hyperthyroidism Mother    Diabetes Mother    Asthma Father    Cataracts Sister    Strabismus Sister    Non-Hodgkin's lymphoma Brother    Cancer Brother    Colon cancer Neg Hx    Stomach cancer Neg Hx    Esophageal cancer Neg Hx    Colon polyps Neg Hx     Social History   Socioeconomic History   Marital status: Single    Spouse name: Not on file   Number of children: 0   Years of education: Not on file   Highest education level: Not on file  Occupational History   Occupation: Unemployed  Tobacco Use   Smoking status: Never    Passive exposure: Yes   Smokeless tobacco: Never   Tobacco comments:    family smokes outside  Vaping Use   Vaping Use: Never used  Substance and Sexual Activity   Alcohol use: No   Drug use: No   Sexual activity: Not on file  Other Topics Concern   Not on file  Social History Narrative   [employer] 22-23 school - lives with brother, sister, step father and mother.    Social Determinants of Health   Financial Resource Strain: Medium Risk (12/30/2021)   Overall Financial Resource Strain (CARDIA)    Difficulty of Paying Living Expenses: Somewhat hard  Food Insecurity: No Food Insecurity (12/30/2021)   Hunger Vital Sign    Worried About Running Out of Food in the Last Year: Never true    Ran Out of Food in the Last Year: Never true  Transportation Needs: Unmet Transportation Needs (03/06/2022)   PRAPARE - Administrator, Civil Service (Medical): Yes    Lack of Transportation (Non-Medical): Yes  Physical Activity: Not on file  Stress: Not on file  Social Connections: Not on file  Intimate Partner Violence: Not on file    Review of Systems:    Constitutional: No weight loss, fever or chills Cardiovascular:  No chest pain  Respiratory: No SOB  Gastrointestinal: See HPI and otherwise negative   Physical Exam:  Vital signs: BP 110/68   Pulse 79   Ht 5\' 4"  (1.626 m)   Wt 229 lb 8 oz (104.1 kg)   LMP 07/22/2022 (Approximate)   BMI 39.39 kg/m    Constitutional:   Pleasant obese Hispanic female appears to be in NAD, Well developed, Well nourished, alert and cooperative Respiratory: Respirations even and unlabored. Lungs clear to auscultation  bilaterally.   No wheezes, crackles, or rhonchi.  Cardiovascular: Normal S1, S2. No MRG. Regular rate and rhythm. No peripheral edema, cyanosis or pallor.  Gastrointestinal:  Soft, nondistended, mild generalized TTP, No rebound or guarding. Normal bowel sounds. No appreciable masses or hepatomegaly. Rectal:  Not performed.  Psychiatric: Demonstrates good judgement and reason without abnormal affect or behaviors.  RELEVANT LABS AND IMAGING: CBC    Component Value Date/Time   WBC 10.5 08/12/2022 1332   RBC 5.50 (H) 08/12/2022 1332   HGB 14.6 08/12/2022 1332   HCT 44.1 08/12/2022 1332   PLT 255 08/12/2022 1332   MCV 80.2 08/12/2022 1332   MCH 26.5 08/12/2022 1332   MCHC 33.1 08/12/2022 1332   RDW 13.6 08/12/2022 1332   LYMPHSABS 2.8 08/12/2022 1332   MONOABS 0.7 08/12/2022 1332   EOSABS 0.2 08/12/2022 1332   BASOSABS 0.0 08/12/2022 1332    CMP     Component Value Date/Time   NA 139 08/12/2022 1332   K 4.3 08/12/2022 1332   CL 110 08/12/2022 1332   CO2 21 (L) 08/12/2022 1332   GLUCOSE 90 08/12/2022 1332   BUN 13 08/12/2022 1332   CREATININE 0.84 08/12/2022 1332   CREATININE 0.79 02/16/2022 1701   CALCIUM 9.3 08/12/2022 1332   PROT 7.9 08/12/2022 1332   ALBUMIN 3.9 08/12/2022 1332   AST 40 08/12/2022 1332   ALT 91 (H) 08/12/2022 1332   ALKPHOS 98 08/12/2022 1332   BILITOT 0.6 08/12/2022 1332   GFRNONAA >60 08/12/2022 1332   GFRAA NOT CALCULATED 03/21/2017 1856   Assessment: 1.  Generalized abdominal pain: With below, seems worse in  her epigastrium; consider gallbladder etiology versus gastritis versus IBS 2.  Constipation: Remains somewhat constipated, MiraLAX helps but she is not using it daily 3.  Bloating: Likely with above 4.  Nausea and vomiting: Seems to come and episodes but this worsens with some abdominal pain, no real help from Pantoprazole 40 mg twice daily; consider relation to gallbladder versus gastritis versus other  Plan: 1.  Would recommend the patient continue her Pantoprazole 40 mg daily for now.  Doubling the dose did not seem to help much.  In the future may even consider discontinuing this. 2.  Recommend the patient take her MiraLAX on a daily basis in order to have regular bowel movements, discussed that this will hopefully help with her bloating.  If she is having regular bowel movements and was still bloated then we will need to work this up further. 3.  It sounds like her nausea and vomiting and pain are coming in cycles which could indicate biliary colic.  Ordered right upper quadrant ultrasound for further evaluation.  Pending results from this could consider HIDA scan. 4.  Patient to follow in clinic in 3 to 4 weeks.  At some point if symptoms are ongoing she will need an EGD and colonoscopy.  Hyacinth Meeker, PA-C [facility] 08/17/2022, 1:26 PM  Cc: [facility]

## 2022-08-20 NOTE — Progress Notes (Signed)
Agree with assessment/plan.  Raj Olman Yono, MD [facility] [phone]  

## 2022-08-25 ENCOUNTER — Ambulatory Visit (HOSPITAL_COMMUNITY)
Admission: RE | Admit: 2022-08-25 | Discharge: 2022-08-25 | Disposition: A | Discharge status: Home / Self Care | Payer: Medicaid Other | Source: Ambulatory Visit | Attending: Physician Assistant | Admitting: Physician Assistant

## 2022-08-25 DIAGNOSIS — R112 Nausea with vomiting, unspecified: Secondary | ICD-10-CM | POA: Insufficient documentation

## 2022-08-25 DIAGNOSIS — R140 Abdominal distension (gaseous): Secondary | ICD-10-CM | POA: Diagnosis present

## 2022-08-25 DIAGNOSIS — R1084 Generalized abdominal pain: Secondary | ICD-10-CM

## 2022-08-25 NOTE — Telephone Encounter (Signed)
HIDA scan order in epic. Secure staff message sent to radiology scheduling to contact patient to set up appt.

## 2022-09-01 MED ORDER — ONDANSETRON 8 MG PO TBDP: 8.0000 mg | ORAL_TABLET | Freq: Three times a day (TID) | ORAL | 0 refills | Status: DC | PRN

## 2022-09-11 ENCOUNTER — Ambulatory Visit (HOSPITAL_COMMUNITY)
Admission: RE | Admit: 2022-09-11 | Discharge: 2022-09-11 | Disposition: A | Discharge status: Home / Self Care | Payer: Medicaid Other | Source: Ambulatory Visit | Attending: Physician Assistant | Admitting: Physician Assistant

## 2022-09-11 DIAGNOSIS — R1084 Generalized abdominal pain: Secondary | ICD-10-CM | POA: Diagnosis present

## 2022-09-11 DIAGNOSIS — R140 Abdominal distension (gaseous): Secondary | ICD-10-CM | POA: Diagnosis present

## 2022-09-11 DIAGNOSIS — R112 Nausea with vomiting, unspecified: Secondary | ICD-10-CM

## 2022-09-11 MED ORDER — TECHNETIUM TC 99M MEBROFENIN IV KIT
5.0000 | PACK | Freq: Once | INTRAVENOUS | Status: AC | PRN
  Administered 2022-09-11: 5 via INTRAVENOUS

## 2022-09-13 ENCOUNTER — Encounter: Payer: Self-pay | Admitting: Family

## 2022-09-13 DIAGNOSIS — R140 Abdominal distension (gaseous): Secondary | ICD-10-CM

## 2022-09-13 DIAGNOSIS — R1084 Generalized abdominal pain: Secondary | ICD-10-CM

## 2022-09-13 DIAGNOSIS — R112 Nausea with vomiting, unspecified: Secondary | ICD-10-CM

## 2022-09-14 ENCOUNTER — Encounter: Payer: Self-pay | Admitting: Family

## 2022-09-14 ENCOUNTER — Encounter: Payer: Self-pay | Admitting: *Deleted

## 2022-09-14 ENCOUNTER — Ambulatory Visit (INDEPENDENT_AMBULATORY_CARE_PROVIDER_SITE_OTHER): Payer: Medicaid Other | Admitting: Family

## 2022-09-14 VITALS — BP 92/51 | HR 71 | Ht 63.78 in | Wt 231.2 lb

## 2022-09-14 DIAGNOSIS — N898 Other specified noninflammatory disorders of vagina: Secondary | ICD-10-CM

## 2022-09-14 DIAGNOSIS — Z1389 Encounter for screening for other disorder: Secondary | ICD-10-CM | POA: Diagnosis not present

## 2022-09-14 DIAGNOSIS — N3001 Acute cystitis with hematuria: Secondary | ICD-10-CM

## 2022-09-14 DIAGNOSIS — Z3202 Encounter for pregnancy test, result negative: Secondary | ICD-10-CM

## 2022-09-14 DIAGNOSIS — Z113 Encounter for screening for infections with a predominantly sexual mode of transmission: Secondary | ICD-10-CM

## 2022-09-14 DIAGNOSIS — R300 Dysuria: Secondary | ICD-10-CM | POA: Diagnosis not present

## 2022-09-14 LAB — POCT URINE PREGNANCY: Preg Test, Ur: NEGATIVE

## 2022-09-14 LAB — POCT URINALYSIS DIPSTICK
Bilirubin, UA: NEGATIVE
Blood, UA: POSITIVE
Glucose, UA: NEGATIVE
Ketones, UA: NEGATIVE
Nitrite, UA: POSITIVE
Protein, UA: POSITIVE — AB
Spec Grav, UA: 1.015 (ref 1.010–1.025)
Urobilinogen, UA: NEGATIVE E.U./dL — AB
pH, UA: 7.5 (ref 5.0–8.0)

## 2022-09-14 MED ORDER — CEPHALEXIN 500 MG PO CAPS: 500.0000 mg | ORAL_CAPSULE | Freq: Two times a day (BID) | ORAL | 0 refills | Status: AC

## 2022-09-14 NOTE — Progress Notes (Signed)
History was provided by the patient.  Alexandra [name] is a 22 y.o. female who is here for one week of pain with urination..  Patient, No Pcp Per   HPI:  Pt reports pain with urination for the past week. She states that she recently shaved with a dirty razor and wonders if she gave herself a UTI. States that she has pain with urination throughout peeing and is going to the bathroom more frequently. When she goes to the bathroom she feels like she does not empty her whole bladder. She has also complained of pruritus and tried an OTC itch cream.  Has noticed some blood in her urine as well for the past few days. Has had ongoing nausea and vomiting but this has been her baseline and is currently undergoing evaluation with GI. She has also some increasing lower back and lower abdominal pain. Describes pain as crampy, like when she has a period but is not due for her period.   Last sexually active about 1 week ago with boyfriend. Usually uses condoms intermittently. Did not use a condom last week. Has noticed change in vaginal discharge and that it is more thick and mucous. Has noticed some spotting after intercourse. Last menstrual cycle was December 15th-December 19th.   Has not had any STI testing that she is aware. Has been eating and drinking well.   No LMP recorded.  Review of Systems  Constitutional:  Negative for chills and fever.  HENT:  Negative for congestion and sore throat.   Respiratory:  Negative for cough and shortness of breath.   Gastrointestinal:  Positive for abdominal pain, constipation, nausea and vomiting.  Genitourinary:  Positive for dysuria, flank pain, frequency and urgency.    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 10/13/2021   Mixed hyperlipidemia 10/13/2021   PTSD (post-traumatic stress disorder) 09/21/2021   Transient alteration of awareness 09/21/2021   Sleep disturbance  08/10/2021   Low back pain 02/02/2021   POTS (postural orthostatic tachycardia syndrome) 01/20/2021   Bulging of lumbar intervertebral disc 12/13/2020   Constipation 12/13/2020   Easy bruising 12/13/2020   Ehlers-Danlos syndrome 12/13/2020   Weight loss 11/16/2020   Chronic bilateral thoracic back pain 11/16/2020   Urinary incontinence without sensory awareness 11/16/2020   Hepatomegaly 06/16/2020   GAD (generalized anxiety disorder) 05/12/2020   Abnormal auditory perception of right ear 05/12/2020   Generalized abdominal pain 06/13/2018   Chronic fatigue 06/13/2018   Generalized hypermobility of joints 06/13/2018   Patellar subluxation 06/13/2018   Joint pain 06/13/2018   Self-injurious behavior 08/09/2017   Gastroesophageal reflux disease 08/09/2017   Attention deficit hyperactivity disorder (ADHD), combined type 08/09/2017   MDD (major depressive disorder), recurrent severe, without psychosis ([place]) 03/21/2017   Chronic nonintractable headache 01/22/2017   Insomnia 01/11/2017   Dizziness 01/04/2017   Suicidal ideation 12/04/2016   Anorexia nervosa with bulimia 11/13/2016    Current Outpatient Medications on File Prior to Visit  Medication Sig Dispense Refill   atomoxetine (STRATTERA) 18 MG capsule Take 18 mg by mouth daily.     FLUoxetine (PROZAC) 40 MG capsule Take 40 mg by mouth daily.     hydrOXYzine (ATARAX) 25 MG tablet Take 25 mg by mouth daily at 6 (six) AM. Pt takes 1 table once a day.     hydrOXYzine (ATARAX) 50 MG tablet Take 1 tablet (50 mg total) by mouth at bedtime. 30 tablet  0   lidocaine (LIDODERM) 5 % Place 1 patch onto the skin daily. Remove & Discard patch within 12 hours or as directed by MD 30 patch 0   ondansetron (ZOFRAN-ODT) 8 MG disintegrating tablet Take 1 tablet (8 mg total) by mouth every 8 (eight) hours as needed for nausea or vomiting. 30 tablet 0   pantoprazole (PROTONIX) 40 MG tablet Take 1 tablet by mouth 30 minutes before breakfast and evening  meal 60 tablet 2   Prenatal Vit-Fe Fumarate-FA (PRENATAL VITAMIN PLUS LOW IRON) 27-1 MG TABS Take 1 tablet by mouth daily at 12 noon. 90 tablet 3   propranolol (INDERAL) 40 MG tablet Take 1 tablet (40 mg total) by mouth 2 (two) times daily. 180 tablet 3   topiramate (TOPAMAX) 25 MG tablet Take 1 tablet (25 mg total) by mouth daily as needed. 90 tablet 3   amoxicillin-clavulanate (AUGMENTIN) 875-125 MG tablet Take 1 tablet by mouth every 12 (twelve) hours. (Patient not taking: Reported on 09/14/2022) 14 tablet 0   No current facility-administered medications on file prior to visit.    Allergies  Allergen Reactions   Bee Pollen Other (See Comments)    "seasonal allergies"   Methylprednisolone Anxiety and Other (See Comments)    Became angry and had a "weird feeling"   Pollen Extract     "seasonal allergies"    Social History: Confidentiality was discussed with the patient and if applicable, with caregiver as well. Tobacco: No Secondhand smoke exposure? yes - boyfriend's dad smokes Drugs/EtOH: denies  Sexually active? yes - using condoms intermittently  Last STI Screening: denies having any STI screening Pregnancy Prevention: condoms  Physical Exam:    Vitals:   09/14/22 1028  BP: (!) 92/51  Pulse: 71  Weight: 231 lb 3.2 oz (104.9 kg)  Height: 5' 3.78" (1.62 m)    Blood pressure %iles are not available for patients who are 18 years or older.  Physical Exam Constitutional:      General: She is not in acute distress.    Appearance: Normal appearance.  HENT:     Head: Normocephalic and atraumatic.     Nose: Nose normal. No congestion or rhinorrhea.     Mouth/Throat:     Mouth: Mucous membranes are moist.     Pharynx: Oropharynx is clear. Posterior oropharyngeal erythema present. No oropharyngeal exudate.  Eyes:     Extraocular Movements: Extraocular movements intact.  Cardiovascular:     Rate and Rhythm: Normal rate and regular rhythm.     Pulses: Normal pulses.      Heart sounds: Normal heart sounds. No murmur heard. Pulmonary:     Effort: Pulmonary effort is normal.     Breath sounds: Normal breath sounds.  Abdominal:     General: Abdomen is flat. Bowel sounds are normal. There is no distension.     Palpations: Abdomen is soft.     Tenderness: There is abdominal tenderness (Tenderness in left and right lower quadrants). There is left CVA tenderness and guarding. There is no right CVA tenderness.  Musculoskeletal:     Cervical back: Normal range of motion and neck supple. No tenderness.  Skin:    General: Skin is warm and dry.     Capillary Refill: Capillary refill takes less than 2 seconds.  Neurological:     General: No focal deficit present.     Mental Status: She is alert.     Assessment/Plan: UTI  -UA with positive nitrites, 4+ leukocytes. Will prescribe 7 day  course of Keflex and encourage increase fluid intake.  2. Sexually Active, with occasional contraception use  - will obtain G/C testing   -urine pregnancy test, negative  3. Constipation with frequent nausea, vomiting:  - encouraged increase fluid intake   - continue to follow with GI, planning to undergo endoscopy later this month  Keene Breath, MD [facility], PGY3   Supervising Provider Co-Signature  I reviewed with the resident the medical history and the resident's findings on physical examination.  I discussed with the resident the patient's diagnosis and concur with the treatment plan as documented in the resident's note.  Parthenia Ames, NP

## 2022-09-15 LAB — WET PREP BY MOLECULAR PROBE
Candida species: DETECTED — AB
Gardnerella vaginalis: NOT DETECTED
MICRO NUMBER:: 14419942
SPECIMEN QUALITY:: ADEQUATE
Trichomonas vaginosis: NOT DETECTED

## 2022-09-15 LAB — URINE CULTURE
MICRO NUMBER:: 14419444
SPECIMEN QUALITY:: ADEQUATE

## 2022-09-15 LAB — C. TRACHOMATIS/N. GONORRHOEAE RNA
C. trachomatis RNA, TMA: NOT DETECTED
N. gonorrhoeae RNA, TMA: NOT DETECTED

## 2022-09-16 ENCOUNTER — Other Ambulatory Visit: Payer: Self-pay | Admitting: Family

## 2022-09-16 MED ORDER — FLUCONAZOLE 150 MG PO TABS: 150.0000 mg | ORAL_TABLET | Freq: Every day | ORAL | 0 refills | Status: DC

## 2022-09-19 ENCOUNTER — Ambulatory Visit (AMBULATORY_SURGERY_CENTER): Payer: Medicaid Other | Admitting: Gastroenterology

## 2022-09-19 ENCOUNTER — Ambulatory Visit: Payer: Medicaid Other | Admitting: Family

## 2022-09-19 ENCOUNTER — Encounter: Payer: Self-pay | Admitting: Gastroenterology

## 2022-09-19 VITALS — BP 105/65 | HR 76 | Temp 97.5°F | Resp 17 | Ht 64.0 in | Wt 229.0 lb

## 2022-09-19 DIAGNOSIS — K219 Gastro-esophageal reflux disease without esophagitis: Secondary | ICD-10-CM

## 2022-09-19 DIAGNOSIS — R110 Nausea: Secondary | ICD-10-CM

## 2022-09-19 DIAGNOSIS — R112 Nausea with vomiting, unspecified: Secondary | ICD-10-CM

## 2022-09-19 DIAGNOSIS — K319 Disease of stomach and duodenum, unspecified: Secondary | ICD-10-CM

## 2022-09-19 DIAGNOSIS — K2289 Other specified disease of esophagus: Secondary | ICD-10-CM | POA: Diagnosis not present

## 2022-09-19 DIAGNOSIS — R140 Abdominal distension (gaseous): Secondary | ICD-10-CM

## 2022-09-19 MED ORDER — SODIUM CHLORIDE 0.9 % IV SOLN: 500.0000 mL | Freq: Once | INTRAVENOUS | Status: DC

## 2022-09-19 NOTE — Progress Notes (Signed)
Pt's states no medical or surgical changes since previsit or office visit. 

## 2022-09-19 NOTE — Patient Instructions (Addendum)
Read all of the handouts given to you by your recovery room  nurse.  Resume your medications as ordered.  Drink plenty of fluids today.  YOU HAD AN ENDOSCOPIC PROCEDURE TODAY AT [facility]:   Refer to the procedure report that was given to you for any specific questions about what was found during the examination.  If the procedure report does not answer your questions, please call your gastroenterologist to clarify.  If you requested that your care partner not be given the details of your procedure findings, then the procedure report has been included in a sealed envelope for you to review at your convenience later.  YOU SHOULD EXPECT: Some feelings of bloating in the abdomen. Passage of more gas than usual.  Walking can help get rid of the air that was put into your GI tract during the procedure and reduce the bloating.   Please Note:  You might notice some irritation and congestion in your nose or some drainage.  This is from the oxygen used during your procedure.  There is no need for concern and it should clear up in a day or so.  SYMPTOMS TO REPORT IMMEDIATELY:   Following upper endoscopy (EGD)  Vomiting of blood or coffee ground material  New chest pain or pain under the shoulder blades  Painful or persistently difficult swallowing  New shortness of breath  Fever of 100F or higher  Black, tarry-looking stools  For urgent or emergent issues, a gastroenterologist can be reached at any hour by calling [phone]. Do not use MyChart messaging for urgent concerns.    DIET:  We do recommend a small meal at first, but then you may proceed to your regular diet.  Drink plenty of fluids but you should avoid alcoholic beverages for 24 hours.  ACTIVITY:  You should plan to take it easy for the rest of today and you should NOT DRIVE or use heavy machinery until tomorrow (because of the sedation medicines used during the test).    FOLLOW UP: Our staff will call the number  listed on your records the next business day following your procedure.  We will call around 7:15- 8:00 am to check on you and address any questions or concerns that you may have regarding the information given to you following your procedure. If we do not reach you, we will leave a message.     If any biopsies were taken you will be contacted by phone or by letter within the next 1-3 weeks.  Please call us at [phone] if you have not heard about the biopsies in 3 weeks.    SIGNATURES/CONFIDENTIALITY: You and/or your care partner have signed paperwork which will be entered into your electronic medical record.  These signatures attest to the fact that that the information above on your After Visit Summary has been reviewed and is understood.  Full responsibility of the confidentiality of this discharge information lies with you and/or your care-partner.

## 2022-09-19 NOTE — Progress Notes (Signed)
Called to room to assist during endoscopic procedure.  Patient ID and intended procedure confirmed with present staff. Received instructions for my participation in the procedure from the performing physician.

## 2022-09-19 NOTE — Op Note (Signed)
[facility] Patient Name: Alexandra [name] Procedure Date: 09/19/2022 10:06 AM MRN: [id number] Endoscopist: Lynann Bologna , MD, [id number] Age: 22 Referring MD:  Date of Birth: December 11, 2000 Gender: Female Account #: [id number] Procedure:                Upper GI endoscopy Indications:              Epigastric abdominal pain with N/V. neg CT, [place] and                            HIDA with EF Medicines:                Monitored Anesthesia Care Procedure:                Pre-Anesthesia Assessment:                           - Prior to the procedure, a History and Physical                            was performed, and patient medications and                            allergies were reviewed. The patient's tolerance of                            previous anesthesia was also reviewed. The risks                            and benefits of the procedure and the sedation                            options and risks were discussed with the patient.                            All questions were answered, and informed consent                            was obtained. Prior Anticoagulants: The patient has                            taken no anticoagulant or antiplatelet agents. ASA                            Grade Assessment: II - A patient with mild systemic                            disease. After reviewing the risks and benefits,                            the patient was deemed in satisfactory condition to                            undergo the procedure.  After obtaining informed consent, the endoscope was                            passed under direct vision. Throughout the                            procedure, the patient's blood pressure, pulse, and                            oxygen saturations were monitored continuously. The                            Endoscope was introduced through the mouth, and                            advanced to the second part of  duodenum. The upper                            GI endoscopy was accomplished without difficulty.                            The patient tolerated the procedure well. Scope In: Scope Out: Findings:                 The examined esophagus was normal. Biopsies were                            obtained from the proximal and distal esophagus                            with cold forceps for histology of suspected                            eosinophilic esophagitis.                           The Z-line was regular and was found 35 cm from the                            incisors.                           The entire examined stomach was normal. Biopsies                            were taken with a cold forceps for histology.                           The examined duodenum was normal. Biopsies for                            histology were taken with a cold forceps for                            evaluation of celiac  disease. Complications:            No immediate complications. Estimated Blood Loss:     Estimated blood loss: none. Impression:               - Normal EGD Recommendation:           - Patient has a contact number available for                            emergencies. The signs and symptoms of potential                            delayed complications were discussed with the                            patient. Return to normal activities tomorrow.                            Written discharge instructions were provided to the                            patient.                           - Resume previous diet.                           - Continue present medications.                           - Await pathology results.                           - The findings and recommendations were discussed                            with the patient's family.                           - If still with problems, follow-up for further                            workup. [name], MD 09/19/2022  10:24:59 AM This report has been signed electronically.

## 2022-09-19 NOTE — Progress Notes (Signed)
Chief Complaint: Follow-up abdominal pain and bloating   HPI:    [name] is an 22 year old Hispanic female, assigned to Dr. Chales Abrahams, with a past medical history as listed below including Erler's Danlos, who presents to clinic today for follow-up of abdominal pain and bloating.    04/14/2022 patient initially seen in the urgent care for generalized abdominal pain and sent to the ER. Described mid diffuse abdominal pain for 2 to 3 weeks. The symptoms were constant. At that time labs showed normal CMP, CBC and lipase. Patient had a CT of the abdomen pelvis which showed shotty subcentimeter mesenteric lymph nodes which were nonspecific but could be seen with mesenteric adenitis.     04/20/2022 patient seen in clinic by that me and discussed being diagnosed with IBS a few years ago she had had troubles with radiation of stools.  At that time recommend that she take MiraLAX on a daily basis, discussed hopefully when she is having a regular stool she would have decreased left lower quadrant pain and nausea.  She had asked about Desipramine and recommended that she discuss this with her primary care physician.  She asked for referral to a dietitian for weight loss and IBS.    06/26/2022 patient describes some stomach pain and nausea.  At that time was recommended she increase her Pantoprazole to twice daily 40 mg before breakfast and dinner.    Today, patient presents to clinic and tells me she thought the Pantoprazole was helping for a while but then about 3 to 4 weeks ago she started with another episode of nausea and some vomiting with epigastric pain and a lot of bloating.  She was told to increase her Pantoprazole to twice a day and she did this for about a week but it made no difference.  She was finally given Zofran by the urgent care and this seemed to help her symptoms some, she has no further vomiting but continues with some pain across the top of her abdomen and a lot of bloating which is  uncomfortable for her.  Apparently tried MiraLAX capsules but was not taking them daily, when she does take them and helps her to have a bowel movement.    Denies fever, chills, weight loss or blood in her stool.       Past Medical History:  Diagnosis Date   Anxiety     Dry skin     Eating disorder     Ehlers-Danlos disease     Fatty liver     POTS (postural orthostatic tachycardia syndrome)     Vision abnormalities             Past Surgical History:  Procedure Laterality Date   DENTAL SURGERY       TYMPANOSTOMY TUBE PLACEMENT                Current Outpatient Medications  Medication Sig Dispense Refill   amoxicillin-clavulanate (AUGMENTIN) 875-125 MG tablet Take 1 tablet by mouth every 12 (twelve) hours. 14 tablet 0   atomoxetine (STRATTERA) 18 MG capsule Take 18 mg by mouth daily.       FLUoxetine (PROZAC) 40 MG capsule Take 40 mg by mouth daily.       hydrOXYzine (ATARAX) 25 MG tablet Take 25 mg by mouth daily at 6 (six) AM. Pt takes 1 table once a day.       hydrOXYzine (ATARAX) 50 MG tablet Take 1 tablet (50 mg total) by mouth at bedtime.  30 tablet 0   lidocaine (LIDODERM) 5 % Place 1 patch onto the skin daily. Remove & Discard patch within 12 hours or as directed by MD 30 patch 0   ondansetron (ZOFRAN-ODT) 8 MG disintegrating tablet Take 1 tablet (8 mg total) by mouth every 8 (eight) hours as needed for nausea or vomiting. 10 tablet 0   pantoprazole (PROTONIX) 40 MG tablet Take 1 tablet by mouth 30 minutes before breakfast and evening meal 60 tablet 2   Prenatal Vit-Fe Fumarate-FA (PRENATAL VITAMIN PLUS LOW IRON) 27-1 MG TABS Take 1 tablet by mouth daily at 12 noon. 90 tablet 3   propranolol (INDERAL) 40 MG tablet Take 1 tablet (40 mg total) by mouth 2 (two) times daily. 180 tablet 3   topiramate (TOPAMAX) 25 MG tablet Take 1 tablet (25 mg total) by mouth daily as needed. 90 tablet 3    No current facility-administered medications for this visit.           Allergies as  of 08/17/2022 - Review Complete 08/12/2022  Allergen Reaction Noted   Bee pollen Other (See Comments) 03/21/2017   Methylprednisolone Anxiety and Other (See Comments) 06/06/2022   Pollen extract   03/21/2017           Family History  Problem Relation Age of Onset   Hyperthyroidism Mother     Diabetes Mother     Asthma Father     Cataracts Sister     Strabismus Sister     Non-Hodgkin's lymphoma Brother     Cancer Brother     Colon cancer Neg Hx     Stomach cancer Neg Hx     Esophageal cancer Neg Hx     Colon polyps Neg Hx        Social History         Socioeconomic History   Marital status: Single      Spouse name: Not on file   Number of children: 0   Years of education: Not on file   Highest education level: Not on file  Occupational History   Occupation: Unemployed  Tobacco Use   Smoking status: Never      Passive exposure: Yes   Smokeless tobacco: Never   Tobacco comments:      family smokes outside  Vaping Use   Vaping Use: Never used  Substance and Sexual Activity   Alcohol use: No   Drug use: No   Sexual activity: Not on file  Other Topics Concern   Not on file  Social History Narrative    [employer] 22-23 school - lives with brother, sister, step father and mother.     Social Determinants of Health        Financial Resource Strain: Medium Risk (12/30/2021)    Overall Financial Resource Strain (CARDIA)     Difficulty of Paying Living Expenses: Somewhat hard  Food Insecurity: No Food Insecurity (12/30/2021)    Hunger Vital Sign     Worried About Running Out of Food in the Last Year: Never true     Ran Out of Food in the Last Year: Never true  Transportation Needs: Unmet Transportation Needs (03/06/2022)    PRAPARE - Armed forces logistics/support/administrative officer (Medical): Yes     Lack of Transportation (Non-Medical): Yes  Physical Activity: Not on file  Stress: Not on file  Social Connections: Not on file  Intimate Partner Violence: Not on file       Review  of Systems:    Constitutional: No weight loss, fever or chills Cardiovascular: No chest pain  Respiratory: No SOB  Gastrointestinal: See HPI and otherwise negative    Physical Exam:  Vital signs: BP 110/68   Pulse 79   Ht 5\' 4"  (1.626 m)   Wt 229 lb 8 oz (104.1 kg)   LMP 07/22/2022 (Approximate)   BMI 39.39 kg/m     Constitutional:   Pleasant obese Hispanic female appears to be in NAD, Well developed, Well nourished, alert and cooperative Respiratory: Respirations even and unlabored. Lungs clear to auscultation bilaterally.   No wheezes, crackles, or rhonchi.  Cardiovascular: Normal S1, S2. No MRG. Regular rate and rhythm. No peripheral edema, cyanosis or pallor.  Gastrointestinal:  Soft, nondistended, mild generalized TTP, No rebound or guarding. Normal bowel sounds. No appreciable masses or hepatomegaly. Rectal:  Not performed.  Psychiatric: Demonstrates good judgement and reason without abnormal affect or behaviors.   RELEVANT LABS AND IMAGING: CBC Labs (Brief)          Component Value Date/Time    WBC 10.5 08/12/2022 1332    RBC 5.50 (H) 08/12/2022 1332    HGB 14.6 08/12/2022 1332    HCT 44.1 08/12/2022 1332    PLT 255 08/12/2022 1332    MCV 80.2 08/12/2022 1332    MCH 26.5 08/12/2022 1332    MCHC 33.1 08/12/2022 1332    RDW 13.6 08/12/2022 1332    LYMPHSABS 2.8 08/12/2022 1332    MONOABS 0.7 08/12/2022 1332    EOSABS 0.2 08/12/2022 1332    BASOSABS 0.0 08/12/2022 1332        CMP     Labs (Brief)          Component Value Date/Time    NA 139 08/12/2022 1332    K 4.3 08/12/2022 1332    CL 110 08/12/2022 1332    CO2 21 (L) 08/12/2022 1332    GLUCOSE 90 08/12/2022 1332    BUN 13 08/12/2022 1332    CREATININE 0.84 08/12/2022 1332    CREATININE 0.79 02/16/2022 1701    CALCIUM 9.3 08/12/2022 1332    PROT 7.9 08/12/2022 1332    ALBUMIN 3.9 08/12/2022 1332    AST 40 08/12/2022 1332    ALT 91 (H) 08/12/2022 1332    ALKPHOS 98 08/12/2022 1332     BILITOT 0.6 08/12/2022 1332    GFRNONAA >60 08/12/2022 1332    GFRAA NOT CALCULATED 03/21/2017 1856      Assessment: 1.  Generalized abdominal pain: With below, seems worse in her epigastrium; consider gallbladder etiology versus gastritis versus IBS 2.  Constipation: Remains somewhat constipated, MiraLAX helps but she is not using it daily 3.  Bloating: Likely with above 4.  Nausea and vomiting: Seems to come and episodes but this worsens with some abdominal pain, no real help from Pantoprazole 40 mg twice daily; consider relation to gallbladder versus gastritis versus other   Plan: 1.  Would recommend the patient continue her Pantoprazole 40 mg daily for now.  Doubling the dose did not seem to help much.  In the future may even consider discontinuing this. 2.  Recommend the patient take her MiraLAX on a daily basis in order to have regular bowel movements, discussed that this will hopefully help with her bloating.  If she is having regular bowel movements and was still bloated then we will need to work this up further. 3.  It sounds like her nausea and vomiting and pain are coming  in cycles which could indicate biliary colic.  Ordered right upper quadrant ultrasound for further evaluation.  Pending results from this could consider HIDA scan. 4.  Patient to follow in clinic in 3 to 4 weeks.  At some point if symptoms are ongoing she will need an EGD and colonoscopy.   Hyacinth Meeker, PA-C [facility]    Attending physician's note   I have taken history, reviewed the chart and examined the patient. I performed a substantive portion of this encounter, including complete performance of at least one of the key components, in conjunction with the APP. I agree with the Advanced Practitioner's note, impression and recommendations.   Neg CT, [place], HIDA with EF For EGD today   Edman Circle, MD [facility] [phone]

## 2022-09-20 ENCOUNTER — Telehealth: Payer: Self-pay | Admitting: *Deleted

## 2022-09-20 NOTE — Telephone Encounter (Signed)
Attempted f/u phone call. No answer. Left message. °

## 2022-09-21 ENCOUNTER — Ambulatory Visit (INDEPENDENT_AMBULATORY_CARE_PROVIDER_SITE_OTHER): Payer: Medicaid Other | Admitting: Physician Assistant

## 2022-09-21 ENCOUNTER — Encounter: Payer: Self-pay | Admitting: Orthopedic Surgery

## 2022-09-21 ENCOUNTER — Encounter: Payer: Self-pay | Admitting: Physician Assistant

## 2022-09-21 ENCOUNTER — Encounter: Payer: Self-pay | Admitting: Physical Medicine and Rehabilitation

## 2022-09-21 VITALS — BP 100/58 | HR 57 | Ht 63.0 in | Wt 232.4 lb

## 2022-09-21 DIAGNOSIS — K5900 Constipation, unspecified: Secondary | ICD-10-CM | POA: Diagnosis not present

## 2022-09-21 DIAGNOSIS — R1013 Epigastric pain: Secondary | ICD-10-CM | POA: Diagnosis not present

## 2022-09-21 DIAGNOSIS — R140 Abdominal distension (gaseous): Secondary | ICD-10-CM

## 2022-09-21 MED ORDER — AMITRIPTYLINE HCL 25 MG PO TABS: 25.0000 mg | ORAL_TABLET | Freq: Every day | ORAL | 1 refills | Status: DC

## 2022-09-21 NOTE — Progress Notes (Signed)
Chief Complaint: Follow up nausea and vomiting  HPI:    [name] is an 22 year old Hispanic female, assigned to Dr. Lyndel Safe, with a past medical history of Erler's Danlos, who returns to clinic today for follow-up of nausea and vomiting.      04/14/2022 patient initially seen in the urgent care for generalized abdominal pain and sent to the ER. Described mid diffuse abdominal pain for 2 to 3 weeks. The symptoms were constant. At that time labs showed normal CMP, CBC and lipase. Patient had a CT of the abdomen pelvis which showed shotty subcentimeter mesenteric lymph nodes which were nonspecific but could be seen with mesenteric adenitis.     04/20/2022 patient seen in clinic by that me and discussed being diagnosed with IBS a few years ago she had had troubles with radiation of stools.  At that time recommend that she take MiraLAX on a daily basis, discussed hopefully when she is having a regular stool she would have decreased left lower quadrant pain and nausea.  She had asked about Desipramine and recommended that she discuss this with her primary care physician.  She asked for referral to a dietitian for weight loss and IBS.    06/26/2022 patient describes some stomach pain and nausea.  At that time was recommended she increase her Pantoprazole to twice daily 40 mg before breakfast and dinner.    08/17/2022 patient seen in clinic and described that she thought the Pantoprazole was helping but then she had another episode of nausea and some vomiting with epigastric pain and bloating.  She had been given Zofran in the urgent care which seemed to help her symptoms some.  She had tried taking MiraLAX capsules but was not taking them daily.  And she did take them and helped her have a bowel movement.  At that time recommended she continue her Pantoprazole 40 daily as the double dose did not help much.  Also recommend she take her MiraLAX on a daily basis.  Ordered a right upper quadrant ultrasound for the  cycles of nausea and vomiting and discussed possible HIDA scan if necessary.  Also discussed possible EGD colonoscopy if testing was negative.    08/25/2022 right upper quadrant ultrasound was normal.    09/11/2022 HIDA scan with [facility] was normal.    09/19/2022 EGD was normal.    Today, the patient tells me she continues with some similar symptoms, continues with epigastric pain worse with certain solid food she eats but overall does feel better when she is using her MiraLAX daily and having more regular bowel movements.    Does ask questions about how she can lose weight and what foods to eat/avoid.  Apparently has a history of anorexia and does not want to count calories.    Denies fever, chills, blood in her stool, nausea or vomiting.  Past Surgical History:  Procedure Laterality Date   DENTAL SURGERY     TYMPANOSTOMY TUBE PLACEMENT      Current Outpatient Medications  Medication Sig Dispense Refill   amoxicillin-clavulanate (AUGMENTIN) 875-125 MG tablet Take 1 tablet by mouth every 12 (twelve) hours. (Patient not taking: Reported on 09/14/2022) 14 tablet 0   atomoxetine (STRATTERA) 18 MG capsule Take 18 mg by mouth daily.     cephALEXin (KEFLEX) 500 MG capsule Take 1 capsule (500 mg total) by mouth 2 (two) times daily for 7 days. 14 capsule 0   fluconazole (DIFLUCAN) 150 MG tablet Take 1 tablet (150 mg total) by mouth  daily. Take 2nd pill on 3rd day after first dose if still having symptoms. 2 tablet 0   FLUoxetine (PROZAC) 40 MG capsule Take 40 mg by mouth daily.     hydrOXYzine (ATARAX) 25 MG tablet Take 25 mg by mouth daily at 6 (six) AM. Pt takes 1 table once a day.     hydrOXYzine (ATARAX) 50 MG tablet Take 1 tablet (50 mg total) by mouth at bedtime. 30 tablet 0   lidocaine (LIDODERM) 5 % Place 1 patch onto the skin daily. Remove & Discard patch within 12 hours or as directed by MD 30 patch 0   ondansetron (ZOFRAN-ODT) 8 MG disintegrating tablet Take 1 tablet (8 mg total) by mouth every 8  (eight) hours as needed for nausea or vomiting. 30 tablet 0   pantoprazole (PROTONIX) 40 MG tablet Take 1 tablet by mouth 30 minutes before breakfast and evening meal 60 tablet 2   Prenatal Vit-Fe Fumarate-FA (PRENATAL VITAMIN PLUS LOW IRON) 27-1 MG TABS Take 1 tablet by mouth daily at 12 noon. 90 tablet 3   propranolol (INDERAL) 40 MG tablet Take 1 tablet (40 mg total) by mouth 2 (two) times daily. 180 tablet 3   topiramate (TOPAMAX) 25 MG tablet Take 1 tablet (25 mg total) by mouth daily as needed. 90 tablet 3   No current facility-administered medications for this visit.    Allergies as of 09/21/2022 - Review Complete 09/19/2022  Allergen Reaction Noted   Bee pollen Other (See Comments) 03/21/2017   Methylprednisolone Anxiety and Other (See Comments) 06/06/2022   Pollen extract  03/21/2017    Family History  Problem Relation Age of Onset   Hyperthyroidism Mother    Diabetes Mother    Asthma Father    Cataracts Sister    Strabismus Sister    Non-Hodgkin's lymphoma Brother    Cancer Brother    Colon cancer Neg Hx    Stomach cancer Neg Hx    Esophageal cancer Neg Hx    Colon polyps Neg Hx     Social History   Socioeconomic History   Marital status: Single    Spouse name: Not on file   Number of children: 0   Years of education: Not on file   Highest education level: Not on file  Occupational History   Occupation: Unemployed  Tobacco Use   Smoking status: Never    Passive exposure: Yes   Smokeless tobacco: Never   Tobacco comments:    family smokes outside  Vaping Use   Vaping Use: Never used  Substance and Sexual Activity   Alcohol use: No   Drug use: No   Sexual activity: Not on file  Other Topics Concern   Not on file  Social History Narrative   [employer] 22-23 school - lives with brother, sister, step father and mother.    Social Determinants of Health   Financial Resource Strain: Medium Risk (12/30/2021)   Overall Financial Resource Strain  (CARDIA)    Difficulty of Paying Living Expenses: Somewhat hard  Food Insecurity: No Food Insecurity (12/30/2021)   Hunger Vital Sign    Worried About Running Out of Food in the Last Year: Never true    Ran Out of Food in the Last Year: Never true  Transportation Needs: Unmet Transportation Needs (03/06/2022)   PRAPARE - Hydrologist (Medical): Yes    Lack of Transportation (Non-Medical): Yes  Physical Activity: Not on file  Stress: Not on file  Social Connections: Not on file  Intimate Partner Violence: Not on file    Review of Systems:    Constitutional: No weight loss, fever or chills Cardiovascular: No chest pain Respiratory: No SOB Gastrointestinal: See HPI and otherwise negative   Physical Exam:  Vital signs: BP (!) 100/58   Pulse (!) 57   Ht 5\' 3"  (1.6 m)   Wt 232 lb 7 oz (105.4 kg)   BMI 41.17 kg/m    Constitutional:   Pleasant obese female appears to be in NAD, Well developed, Well nourished, alert and cooperative Respiratory: Respirations even and unlabored. Lungs clear to auscultation bilaterally.   No wheezes, crackles, or rhonchi.  Cardiovascular: Normal S1, S2. No MRG. Regular rate and rhythm. No peripheral edema, cyanosis or pallor.  Gastrointestinal:  Soft, nondistended, nontender. No rebound or guarding. Normal bowel sounds. No appreciable masses or hepatomegaly. Rectal:  Not performed.  Psychiatric: Oriented to person, place and time. Demonstrates good judgement and reason without abnormal affect or behaviors.  RELEVANT LABS AND IMAGING: CBC    Component Value Date/Time   WBC 10.5 08/12/2022 1332   RBC 5.50 (H) 08/12/2022 1332   HGB 14.6 08/12/2022 1332   HCT 44.1 08/12/2022 1332   PLT 255 08/12/2022 1332   MCV 80.2 08/12/2022 1332   MCH 26.5 08/12/2022 1332   MCHC 33.1 08/12/2022 1332   RDW 13.6 08/12/2022 1332   LYMPHSABS 2.8 08/12/2022 1332   MONOABS 0.7 08/12/2022 1332   EOSABS 0.2 08/12/2022 1332   BASOSABS 0.0  08/12/2022 1332    CMP     Component Value Date/Time   NA 139 08/12/2022 1332   K 4.3 08/12/2022 1332   CL 110 08/12/2022 1332   CO2 21 (L) 08/12/2022 1332   GLUCOSE 90 08/12/2022 1332   BUN 13 08/12/2022 1332   CREATININE 0.84 08/12/2022 1332   CREATININE 0.79 02/16/2022 1701   CALCIUM 9.3 08/12/2022 1332   PROT 7.9 08/12/2022 1332   ALBUMIN 3.9 08/12/2022 1332   AST 40 08/12/2022 1332   ALT 91 (H) 08/12/2022 1332   ALKPHOS 98 08/12/2022 1332   BILITOT 0.6 08/12/2022 1332   GFRNONAA >60 08/12/2022 1332   GFRAA NOT CALCULATED 03/21/2017 1856    Assessment: 1.  Epigastric pain with nausea and vomiting: Extensive workup including EGD, ultrasound and HIDA scan all unrevealing; likely functional 2.  Constipation: Better with MiraLAX when she takes it every day, likely contributing to above due to IBS  Plan: 1.  At this time started Amitriptyline 25 mg nightly to see if this helps with her functional abdominal pain.  This could be increased to 50 mg nightly if helpful. 2.  Recommend the patient take her MiraLAX every day 3.  Reviewed recent EGD findings and answered questions. 4.  Discussed weight loss with the patient, recommend low-carb and high-protein discussed 30 to 40 g of protein per meal 3 times a day to help satiation.  Explained that if she is still not having any luck over the next month or 2 she can call and we can give her referral to a dietitian who could be even more detailed. 5.  Patient was scheduled follow-up with Dr. 03/23/2017 in 6 to 8 weeks.  Chales Abrahams, PA-C [facility] 09/21/2022, 2:06 PM

## 2022-09-21 NOTE — Progress Notes (Signed)
Agree with assessment/plan.  Raj Tifany Hirsch, MD [facility] [phone]  

## 2022-09-21 NOTE — Patient Instructions (Signed)
We have sent the following medications to your pharmacy for you to pick up at your convenience: Amitriptyline  Take 1 tablet by mouth at bedtime.   Start Miralax- 1 capful daily.   Follow up with Dr. Lyndel Safe on 10/26/22 at 2:10 pm   _______________________________________________________  If your blood pressure at your visit was 140/90 or greater, please contact your primary care physician to follow up on this.  _______________________________________________________  If you are age 72 or older, your body mass index should be between 23-30. Your Body mass index is 41.17 kg/m. If this is out of the aforementioned range listed, please consider follow up with your Primary Care Provider.  If you are age 48 or younger, your body mass index should be between 19-25. Your Body mass index is 41.17 kg/m. If this is out of the aformentioned range listed, please consider follow up with your Primary Care Provider.   ________________________________________________________  The [facility] providers would like to encourage you to use [facility] to communicate with providers for non-urgent requests or questions.  Due to long hold times on the telephone, sending your provider a message by [facility] may be a faster and more efficient way to get a response.  Please allow 48 business hours for a response.  Please remember that this is for non-urgent requests.  _______________________________________________________  Thank you for choosing me and [facility].  Ellouise Newer PA-C

## 2022-09-24 ENCOUNTER — Encounter: Payer: Self-pay | Admitting: Gastroenterology

## 2022-10-02 NOTE — Telephone Encounter (Signed)
Ms. Jerilee Hoh is going to be very sensitive to any medicines Lets hold off on pharmacologic therapy. Can have small meals. Please keep food diary to determine if any particular food makes her symptoms worse Do not eat 3 hours before going to bed  RG

## 2022-10-12 ENCOUNTER — Encounter: Payer: Self-pay | Admitting: Orthopedic Surgery

## 2022-10-12 NOTE — Telephone Encounter (Signed)
Referral has been faxed [facility], give them a few days and if you have not heard from them to schedule appt please call them. Their number is [phone]

## 2022-10-18 ENCOUNTER — Encounter: Payer: Self-pay | Admitting: Family

## 2022-10-19 ENCOUNTER — Other Ambulatory Visit: Payer: Self-pay | Admitting: Family

## 2022-10-19 MED ORDER — CLINDAMYCIN PHOS-BENZOYL PEROX 1.2-5 % EX GEL: CUTANEOUS | 0 refills | Status: DC

## 2022-10-19 MED ORDER — BENZACLIN 1-5 % EX GEL: CUTANEOUS | 11 refills | Status: DC

## 2022-10-19 NOTE — Progress Notes (Signed)
Alexandra [name]

## 2022-10-22 ENCOUNTER — Encounter: Payer: Self-pay | Admitting: Family

## 2022-10-25 ENCOUNTER — Ambulatory Visit (INDEPENDENT_AMBULATORY_CARE_PROVIDER_SITE_OTHER): Payer: Medicaid Other | Admitting: Nurse Practitioner

## 2022-10-25 ENCOUNTER — Encounter: Payer: Self-pay | Admitting: Nurse Practitioner

## 2022-10-25 ENCOUNTER — Encounter: Payer: Self-pay | Admitting: Family

## 2022-10-25 VITALS — BP 106/62 | HR 65 | Temp 97.4°F | Ht 64.0 in | Wt 229.2 lb

## 2022-10-25 DIAGNOSIS — Z1329 Encounter for screening for other suspected endocrine disorder: Secondary | ICD-10-CM

## 2022-10-25 DIAGNOSIS — Z13220 Encounter for screening for lipoid disorders: Secondary | ICD-10-CM | POA: Insufficient documentation

## 2022-10-25 DIAGNOSIS — Z131 Encounter for screening for diabetes mellitus: Secondary | ICD-10-CM

## 2022-10-25 DIAGNOSIS — Z0000 Encounter for general adult medical examination without abnormal findings: Secondary | ICD-10-CM

## 2022-10-25 NOTE — Assessment & Plan Note (Signed)
-   Lipid Panel  2. Thyroid disorder screen  - Thyroid Panel With TSH  3. Routine adult health maintenance  - CBC - Comprehensive metabolic panel  4. Diabetes mellitus screening  - Hemoglobin A1c  Follow up:  Follow up in 6 months

## 2022-10-25 NOTE — Progress Notes (Signed)
$@Patientr$  ID: Alexandra [name], female    DOB: 01/31/2001, 22 y.o.   MRN: [id number]  Chief Complaint  Patient presents with   Establish Care    Referring provider: [facility]   HPI  22 year old female with history of POTS, hepatomegaly, GERD Ehlers-Danlos syndrome, depression, anxiety ADHD, abdominal pain, constipation, insomnia.  Patient presents today to establish care.  She would like blood work completed today.  Overall she is doing well.  She would like a note for school for accommodations due to the fact that she has POTS and Ehlers-Danlos syndrome as well as chronic bowel issues.  Patient does follow with psychiatry for anxiety depression and ADHD.  She does follow with cardiology as well. Denies f/c/s, n/v/d, hemoptysis, PND, leg swelling Denies chest pain or edema        Allergies  Allergen Reactions   Bee Pollen Other (See Comments)    "seasonal allergies"   Methylprednisolone Anxiety and Other (See Comments)    Became angry and had a "weird feeling"   Pollen Extract     "seasonal allergies"    Immunization History  Administered Date(s) Administered   Influenza,inj,Quad PF,6+ Mos 07/21/2019, 05/31/2021    Past Medical History:  Diagnosis Date   Anxiety    Dry skin    Eating disorder    Ehlers-Danlos disease    Fatty liver    POTS (postural orthostatic tachycardia syndrome)    Vision abnormalities     Tobacco History: Social History   Tobacco Use  Smoking Status Never   Passive exposure: Yes  Smokeless Tobacco Never  Tobacco Comments   family smokes outside   Counseling given: Not Answered Tobacco comments: family smokes outside   Outpatient Encounter Medications as of 10/25/2022  Medication Sig   FLUoxetine (PROZAC) 40 MG capsule Take 40 mg by mouth daily.   hydrOXYzine (ATARAX) 25 MG tablet Take 25 mg by mouth daily at 6 (six) AM. Pt takes 1 table once a day.   hydrOXYzine (ATARAX) 50 MG tablet Take 1 tablet (50 mg  total) by mouth at bedtime.   ondansetron (ZOFRAN-ODT) 8 MG disintegrating tablet Take 1 tablet (8 mg total) by mouth every 8 (eight) hours as needed for nausea or vomiting.   Prenatal Vit-Fe Fumarate-FA (PRENATAL VITAMIN PLUS LOW IRON) 27-1 MG TABS Take 1 tablet by mouth daily at 12 noon.   propranolol (INDERAL) 40 MG tablet Take 1 tablet (40 mg total) by mouth 2 (two) times daily.   topiramate (TOPAMAX) 25 MG tablet Take 1 tablet (25 mg total) by mouth daily as needed.   amitriptyline (ELAVIL) 25 MG tablet Take 1 tablet (25 mg total) by mouth at bedtime. (Patient not taking: Reported on 10/25/2022)   amoxicillin-clavulanate (AUGMENTIN) 875-125 MG tablet Take 1 tablet by mouth every 12 (twelve) hours. (Patient not taking: Reported on 10/25/2022)   atomoxetine (STRATTERA) 18 MG capsule Take 18 mg by mouth daily.   Clindamycin-Benzoyl Per, Refr, gel Apply topically twice daily to clean face. (Patient not taking: Reported on 10/25/2022)   fluconazole (DIFLUCAN) 150 MG tablet Take 1 tablet (150 mg total) by mouth daily. Take 2nd pill on 3rd day after first dose if still having symptoms. (Patient not taking: Reported on 10/25/2022)   lidocaine (LIDODERM) 5 % Place 1 patch onto the skin daily. Remove & Discard patch within 12 hours or as directed by MD (Patient not taking: Reported on 10/25/2022)   pantoprazole (PROTONIX) 40 MG tablet Take 1 tablet by  mouth 30 minutes before breakfast and evening meal (Patient not taking: Reported on 10/25/2022)   No facility-administered encounter medications on file as of 10/25/2022.     Review of Systems  Review of Systems  Constitutional: Negative.   HENT: Negative.    Cardiovascular: Negative.   Gastrointestinal: Negative.   Allergic/Immunologic: Negative.   Neurological: Negative.   Psychiatric/Behavioral: Negative.         Physical Exam  BP 106/62   Pulse 65   Temp (!) 97.4 F (36.3 C)   Ht 5' 4"$  (1.626 m)   Wt 229 lb 3.2 oz (104 kg)   LMP  10/20/2022   SpO2 100%   BMI 39.34 kg/m   Wt Readings from Last 5 Encounters:  10/25/22 229 lb 3.2 oz (104 kg) (99 %, Z= 2.31)*  09/21/22 232 lb 7 oz (105.4 kg) (>99 %, Z= 2.34)*  09/19/22 229 lb (103.9 kg) (99 %, Z= 2.30)*  09/14/22 231 lb 3.2 oz (104.9 kg) (99 %, Z= 2.32)*  08/17/22 229 lb 8 oz (104.1 kg) (99 %, Z= 2.30)*   * Growth percentiles are based on CDC (Girls, 2-20 Years) data.     Physical Exam Vitals and nursing note reviewed.  Constitutional:      General: She is not in acute distress.    Appearance: She is well-developed.  Cardiovascular:     Rate and Rhythm: Normal rate and regular rhythm.  Pulmonary:     Effort: Pulmonary effort is normal.     Breath sounds: Normal breath sounds.  Neurological:     Mental Status: She is alert and oriented to person, place, and time.      Lab Results:  CBC    Component Value Date/Time   WBC 10.5 08/12/2022 1332   RBC 5.50 (H) 08/12/2022 1332   HGB 14.6 08/12/2022 1332   HCT 44.1 08/12/2022 1332   PLT 255 08/12/2022 1332   MCV 80.2 08/12/2022 1332   MCH 26.5 08/12/2022 1332   MCHC 33.1 08/12/2022 1332   RDW 13.6 08/12/2022 1332   LYMPHSABS 2.8 08/12/2022 1332   MONOABS 0.7 08/12/2022 1332   EOSABS 0.2 08/12/2022 1332   BASOSABS 0.0 08/12/2022 1332    BMET    Component Value Date/Time   NA 139 08/12/2022 1332   K 4.3 08/12/2022 1332   CL 110 08/12/2022 1332   CO2 21 (L) 08/12/2022 1332   GLUCOSE 90 08/12/2022 1332   BUN 13 08/12/2022 1332   CREATININE 0.84 08/12/2022 1332   CREATININE 0.79 02/16/2022 1701   CALCIUM 9.3 08/12/2022 1332   GFRNONAA >60 08/12/2022 1332   GFRAA NOT CALCULATED 03/21/2017 1856      Assessment & Plan:   Lipid screening - Lipid Panel  2. Thyroid disorder screen  - Thyroid Panel With TSH  3. Routine adult health maintenance  - CBC - Comprehensive metabolic panel  4. Diabetes mellitus screening  - Hemoglobin A1c  Follow up:  Follow up in 6  months     Fenton Foy, NP 10/25/2022

## 2022-10-25 NOTE — Patient Instructions (Signed)
1. Lipid screening  - Lipid Panel  2. Thyroid disorder screen  - Thyroid Panel With TSH  3. Routine adult health maintenance  - CBC - Comprehensive metabolic panel  4. Diabetes mellitus screening  - Hemoglobin A1c  Follow up:  Follow up in 6 months

## 2022-10-26 ENCOUNTER — Ambulatory Visit (INDEPENDENT_AMBULATORY_CARE_PROVIDER_SITE_OTHER): Payer: Medicaid Other | Admitting: Gastroenterology

## 2022-10-26 ENCOUNTER — Encounter: Payer: Self-pay | Admitting: Gastroenterology

## 2022-10-26 VITALS — BP 108/68 | HR 68 | Ht 64.0 in | Wt 230.4 lb

## 2022-10-26 DIAGNOSIS — K760 Fatty (change of) liver, not elsewhere classified: Secondary | ICD-10-CM

## 2022-10-26 DIAGNOSIS — R1013 Epigastric pain: Secondary | ICD-10-CM

## 2022-10-26 DIAGNOSIS — K581 Irritable bowel syndrome with constipation: Secondary | ICD-10-CM | POA: Diagnosis not present

## 2022-10-26 DIAGNOSIS — R112 Nausea with vomiting, unspecified: Secondary | ICD-10-CM

## 2022-10-26 LAB — LIPID PANEL
Chol/HDL Ratio: 5.3 ratio — ABNORMAL HIGH (ref 0.0–4.4)
Cholesterol, Total: 200 mg/dL — ABNORMAL HIGH (ref 100–169)
HDL: 38 mg/dL — ABNORMAL LOW (ref >39)
LDL Chol Calc (NIH): 134 mg/dL — ABNORMAL HIGH (ref 0–109)
Triglycerides: 158 mg/dL — ABNORMAL HIGH (ref 0–89)
VLDL Cholesterol Cal: 28 mg/dL (ref 5–40)

## 2022-10-26 LAB — COMPREHENSIVE METABOLIC PANEL
ALT: 39 IU/L — ABNORMAL HIGH (ref 0–32)
AST: 22 IU/L (ref 0–40)
Albumin/Globulin Ratio: 2 (ref 1.2–2.2)
Albumin: 4.9 g/dL (ref 4.0–5.0)
Alkaline Phosphatase: 88 IU/L (ref 42–106)
BUN/Creatinine Ratio: 15 (ref 9–23)
BUN: 11 mg/dL (ref 6–20)
Bilirubin Total: 0.4 mg/dL (ref 0.0–1.2)
CO2: 17 mmol/L — ABNORMAL LOW (ref 20–29)
Calcium: 10.3 mg/dL — ABNORMAL HIGH (ref 8.7–10.2)
Chloride: 107 mmol/L — ABNORMAL HIGH (ref 96–106)
Creatinine, Ser: 0.75 mg/dL (ref 0.57–1.00)
Globulin, Total: 2.5 g/dL (ref 1.5–4.5)
Glucose: 90 mg/dL (ref 70–99)
Potassium: 4.3 mmol/L (ref 3.5–5.2)
Sodium: 142 mmol/L (ref 134–144)
Total Protein: 7.4 g/dL (ref 6.0–8.5)
eGFR: 118 mL/min/{1.73_m2} (ref >59)

## 2022-10-26 LAB — THYROID PANEL WITH TSH
Free Thyroxine Index: 1.9 (ref 1.2–4.9)
T3 Uptake Ratio: 24 % (ref 24–39)
T4, Total: 8 ug/dL (ref 4.5–12.0)
TSH: 1.44 u[IU]/mL (ref 0.450–4.500)

## 2022-10-26 LAB — CBC
Hematocrit: 42.7 % (ref 34.0–46.6)
Hemoglobin: 14.4 g/dL (ref 11.1–15.9)
MCH: 27.3 pg (ref 26.6–33.0)
MCHC: 33.7 g/dL (ref 31.5–35.7)
MCV: 81 fL (ref 79–97)
Platelets: 244 10*3/uL (ref 150–450)
RBC: 5.28 x10E6/uL (ref 3.77–5.28)
RDW: 13.6 % (ref 11.7–15.4)
WBC: 5.7 10*3/uL (ref 3.4–10.8)

## 2022-10-26 LAB — HEMOGLOBIN A1C
Est. average glucose Bld gHb Est-mCnc: 108 mg/dL
Hgb A1c MFr Bld: 5.4 % (ref 4.8–5.6)

## 2022-10-26 MED ORDER — PANTOPRAZOLE SODIUM 40 MG PO TBEC: 40.0000 mg | DELAYED_RELEASE_TABLET | Freq: Every day | ORAL | 4 refills | Status: DC

## 2022-10-26 MED ORDER — LINACLOTIDE 72 MCG PO CAPS: 72.0000 ug | ORAL_CAPSULE | Freq: Every day | ORAL | 2 refills | Status: DC

## 2022-10-26 MED ORDER — AMITRIPTYLINE HCL 10 MG PO TABS: 10.0000 mg | ORAL_TABLET | Freq: Every day | ORAL | 6 refills | Status: DC

## 2022-10-26 NOTE — Patient Instructions (Addendum)
_______________________________________________________  If your blood pressure at your visit was 140/90 or greater, please contact your primary care physician to follow up on this.  _______________________________________________________  If you are age 20 or older, your body mass index should be between 23-30. Your Body mass index is 39.54 kg/m. If this is out of the aforementioned range listed, please consider follow up with your Primary Care Provider.  If you are age 73 or younger, your body mass index should be between 19-25. Your Body mass index is 39.54 kg/m. If this is out of the aformentioned range listed, please consider follow up with your Primary Care Provider.   ________________________________________________________  The [facility] providers would like to encourage you to use [facility] to communicate with providers for non-urgent requests or questions.  Due to long hold times on the telephone, sending your provider a message by [facility] may be a faster and more efficient way to get a response.  Please allow 48 business hours for a response.  Please remember that this is for non-urgent requests.  _______________________________________________________  We have sent the following medications to your pharmacy for you to pick up at your convenience: Protonix Elavil  We have given you samples of the following medication to take: Linzess 67mg  Please follow up in 3 months. Give [facility] call at [phone] schedule an appointment.  Try gluten free diet for 2 weeks  Gluten-Free Diet The gluten-free diet includes all foods that do not contain gluten. Gluten is a protein that is found in wheat, rye, barley, and some other grains. Following the gluten-free diet is the only treatment for people with celiac disease. It helps to prevent damage to the intestines and improves or eliminates the symptoms of celiac disease. Following the gluten-free diet requires some planning. It can be  challenging at first, but it gets easier with time and practice. There are more gluten-free options available today than ever before. If you need help finding gluten-free foods or if you have questions, talk with your dietitian or health care provider. What are tips for following this plan? Reading food labels Read all food labels. Gluten is often added to foods. Always check the ingredient list and look for warnings that the food may contain gluten. Foods that list any of these key words on the label usually contain gluten: Wheat, flour, enriched flour, bromated flour, white flour, durum flour, graham flour, phosphated flour, self-rising flour, semolina, farina, barley (malt), rye, and oats. Starch, dextrin, modified food starch, or cereal. Thickening, fillers, or emulsifiers. Malt flavoring, malt extract, or malt syrup. Hydrolyzed vegetable protein. In the [place] packaged foods that are gluten-free are required to be labeled "GF." These foods should be easy to identify and are safe to eat. In the [place] food companies are also required to list common food allergens, including wheat, on their labels. Shopping When grocery shopping, start in the produce, meat, and dairy sections. These areas are more likely to contain gluten-free foods. Then move to the aisles that contain packaged foods if you need to. Meal planning All fruits, vegetables, and meats are safe to eat and do not contain gluten. Talk with your dietitian or health care provider before taking a gluten-free multivitamin or mineral supplement. Be aware of gluten-free foods having contact with foods that contain gluten (cross-contamination). This can happen at home and with any processed foods. Talk with your health care provider or dietitian about how to reduce the risk of cross-contamination in your home. If you have questions  about how a food is processed, ask the manufacturer. What foods can I eat? Fruits All plain fresh, frozen,  canned, and dried fruits, and 100% fruit juices. Vegetables All plain fresh, frozen, and canned vegetables, and 100% vegetable juices. Grains Amaranth, bean flours, 100% buckwheat flour, corn, millet, nut flours or nut meals, GF oats, quinoa, rice, sorghum, teff, rice wafers, pure cornmeal tortillas, popcorn, and hot cereals made from cornmeal or GF grains. Hominy, rice, and wild rice. Some Asian rice noodles or bean noodles. Arrowroot starch, corn bran, corn flour, corn germ, cornmeal, corn starch, potato flour, potato starch flour, and rice bran. Plain, brown, and sweet rice flours. Rice polish, soy flour, and tapioca starch. Meats and other protein foods All fresh beef, pork, poultry, fish, seafood, and eggs. Fish canned in water, oil, brine, or vegetable broth. Plain nuts and seeds, peanut butter.  Some precooked or cured meat, such as sausages or meat loaves. Some frankfurters. Dried beans, dried peas, and lentils. Dairy Fresh plain, dry, evaporated, or condensed milk. Cream, butter, sour cream, whipping cream, and most yogurts. Unprocessed cheese, most processed cheeses, some cottage cheeses, and some cream cheeses. Beverages Coffee, tea, and most herbal teas. Carbonated beverages and some root beers. Wine, sake, and pure distilled spirits, such as gin, vodka, and whiskey. Most hard ciders. Fats and oils Butter, margarine, vegetable oil, hydrogenated butter, olive oil, shortening, lard, cream, and some mayonnaise. Some commercial salad dressings. Olives. Sweets and desserts Sugar, honey, some syrups, molasses, jelly, and jam. Plain hard candy, marshmallows, and gumdrops.  Pure cocoa powder. Plain chocolate. Custard and some pudding mixes. Gelatin desserts, sorbets, frozen ice pops, and sherbet.  Cake, cookies, and other desserts prepared with allowed flours. Some commercial ice creams. Cornstarch, tapioca, and rice puddings. Seasoning and other foods Some canned or frozen soups. Monosodium  glutamate (MSG). Cider, rice, and wine vinegar. Baking soda and baking powder.  Cream of tartar. Baking and nutritional yeast. Certain soy sauces made without wheat. Ask your dietitian about specific brands that are allowed. Nuts, coconut, and chocolate. Salt, pepper, herbs, spices, flavoring extracts, imitation or artificial flavorings, natural flavorings, and food colorings.  Some medicines and supplements. Rice syrups. The items listed above may not be a complete list of foods and beverages you can eat and drink. Contact a dietitian for more information. What foods should I avoid? Fruits Thickened or prepared fruits and some pie fillings. Some fruit snacks and fruit roll-ups. Vegetables Most creamed vegetables and most vegetables canned in sauces. Some commercially prepared vegetables and salads. Vegetables in a soy sauce marinade or dressing. Grains Barley, bran, bulgur, couscous, cracked wheat, [place], farro, graham, malt, matzo, semolina, wheat germ, and all wheat and rye cereals, including spelt and kamut. Cereals containing malt as a flavoring, such as rice cereal. Noodles, spaghetti, macaroni, most packaged rice mixes, and all mixes containing wheat, rye, barley, or triticale. Meats and other protein foods Any meat or meat alternative containing wheat, rye, barley, or gluten stabilizers. These are often marinated or packaged meats, and precooked or cured meat, such as sausages or meat loaves. Bread-containing products, such as Swiss steak, croquettes, meatballs, and meatloaf. Most tuna canned in vegetable broth. [place] with hydrolyzed vegetable protein (HVP) injected as part of the basting. [place]. Imitation fish. Eggs in sauces made from ingredients to avoid. Dairy Commercial chocolate milk drinks and malted milk. Some non-dairy creamers. Any cheese product containing ingredients to avoid. Beverages Certain cereal beverages. Beer, ale, malted milk, and some root beers. Some hard  ciders.  Some instant flavored coffees. Some herbal teas made with barley or with barley malt added. Fats and oils Some commercial salad dressings. Sour cream containing modified food starch. Sweets and desserts Some toffees. Chocolate-coated nuts (may be rolled in wheat flour) and some commercial candies and candy bars.  Most cakes, cookies, donuts, pastries, and other baked goods. Some commercial ice cream. Ice cream cones.  Commercially prepared mixes for cakes, cookies, and other desserts. Bread pudding and other puddings thickened with flour.  Products containing brown rice syrup made with barley malt enzyme. Desserts and sweets made with malt flavoring. Seasoning and other foods Some curry powders, some dry seasoning mixes, some gravy extracts, some meat sauces, some ketchups, some prepared mustards, and horseradish.  Certain soy sauces. Malt vinegar. Bouillon and bouillon cubes that contain HVP. Some chip dips. Some chewing gum. Yeast extract. Brewer's yeast. Caramel color.  Some medicines and supplements. The items listed above may not be a complete list of foods and beverages you should avoid. Contact a dietitian for more information. Summary Gluten is a protein that is found in wheat, rye, barley, and some other grains. The gluten-free diet includes all foods that do not contain gluten. If you need help finding gluten-free foods or if you have questions, talk with your dietitian or your health care provider. Read all food labels. Gluten is often added to foods. Always check the ingredient list and look for warnings that the food may contain gluten. This information is not intended to replace advice given to you by your health care provider. Make sure you discuss any questions you have with your health care provider. Document Revised: 07/12/2021 Document Reviewed: 07/12/2021 Elsevier Patient Education  [place].

## 2022-10-26 NOTE — Progress Notes (Signed)
Chief Complaint: Follow up nausea and vomiting   HPI:    [name] is an 22 year old Hispanic female, assigned to Dr. Lyndel Safe, with a past medical history of Erler Danlos, who returns to clinic today for follow-up of nausea and vomiting.        04/14/2022 patient initially seen in the urgent care for generalized abdominal pain and sent to the ER. Described mid diffuse abdominal pain for 2 to 3 weeks. The symptoms were constant. At that time labs showed normal CMP, CBC and lipase. Patient had a CT of the abdomen pelvis which showed shotty subcentimeter mesenteric lymph nodes which were nonspecific but could be seen with mesenteric adenitis.     04/20/2022 patient seen in clinic by that me and discussed being diagnosed with IBS a few years ago she had had troubles with radiation of stools.  At that time recommend that she take MiraLAX on a daily basis, discussed hopefully when she is having a regular stool she would have decreased left lower quadrant pain and nausea.  She had asked about Desipramine and recommended that she discuss this with her primary care physician.  She asked for referral to a dietitian for weight loss and IBS.    06/26/2022 patient describes some stomach pain and nausea.  At that time was recommended she increase her Pantoprazole to twice daily 40 mg before breakfast and dinner.    08/17/2022 patient seen in clinic and described that she thought the Pantoprazole was helping but then she had another episode of nausea and some vomiting with epigastric pain and bloating.  She had been given Zofran in the urgent care which seemed to help her symptoms some.  She had tried taking MiraLAX capsules but was not taking them daily.  And she did take them and helped her have a bowel movement.  At that time recommended she continue her Pantoprazole 40 daily as the double dose did not help much.  Also recommend she take her MiraLAX on a daily basis.  Ordered a right upper quadrant ultrasound  for the cycles of nausea and vomiting and discussed possible HIDA scan if necessary.  Also discussed possible EGD colonoscopy if testing was negative.    08/25/2022 right upper quadrant ultrasound was normal.    09/11/2022 HIDA scan with CCK was normal.    09/19/2022 EGD was normal with negative small bowel biopsies.  Positive biopsies for reflux.  Continued similar symptoms with epi pain, intermittent N/V without weight loss. More problems with pizzas Continued problems with constipation MiraLAX not working as "body is getting used to it".  She takes mag citrate when she gets severely constipated.  Denies fever, chills, blood in her stool, nausea or vomiting.  No melena or hematochezia  Blood tests from yesterday were all normal or baseline except ALT 39 (previously 91 08/2022).  Normal CBC with hemoglobin 14.4, Nl TSH   For anxiety/history of anorexia nervosa-follows with psychologist/psychiatrist at [facility] Readings from Last 3 Encounters:  10/26/22 230 lb 6 oz (104.5 kg) (99 %, Z= 2.32)*  10/25/22 229 lb 3.2 oz (104 kg) (99 %, Z= 2.31)*  09/21/22 232 lb 7 oz (105.4 kg) (>99 %, Z= 2.34)*   * Growth percentiles are based on CDC (Girls, 2-20 Years) data.           Past Surgical History:  Procedure Laterality Date   DENTAL SURGERY       TYMPANOSTOMY TUBE PLACEMENT  Current Outpatient Medications  Medication Sig Dispense Refill   amoxicillin-clavulanate (AUGMENTIN) 875-125 MG tablet Take 1 tablet by mouth every 12 (twelve) hours. (Patient not taking: Reported on 09/14/2022) 14 tablet 0   atomoxetine (STRATTERA) 18 MG capsule Take 18 mg by mouth daily.       cephALEXin (KEFLEX) 500 MG capsule Take 1 capsule (500 mg total) by mouth 2 (two) times daily for 7 days. 14 capsule 0   fluconazole (DIFLUCAN) 150 MG tablet Take 1 tablet (150 mg total) by mouth daily. Take 2nd pill on 3rd day after first dose if still having symptoms. 2 tablet 0   FLUoxetine (PROZAC) 40  MG capsule Take 40 mg by mouth daily.       hydrOXYzine (ATARAX) 25 MG tablet Take 25 mg by mouth daily at 6 (six) AM. Pt takes 1 table once a day.       hydrOXYzine (ATARAX) 50 MG tablet Take 1 tablet (50 mg total) by mouth at bedtime. 30 tablet 0   lidocaine (LIDODERM) 5 % Place 1 patch onto the skin daily. Remove & Discard patch within 12 hours or as directed by MD 30 patch 0   ondansetron (ZOFRAN-ODT) 8 MG disintegrating tablet Take 1 tablet (8 mg total) by mouth every 8 (eight) hours as needed for nausea or vomiting. 30 tablet 0   pantoprazole (PROTONIX) 40 MG tablet Take 1 tablet by mouth 30 minutes before breakfast and evening meal 60 tablet 2   Prenatal Vit-Fe Fumarate-FA (PRENATAL VITAMIN PLUS LOW IRON) 27-1 MG TABS Take 1 tablet by mouth daily at 12 noon. 90 tablet 3   propranolol (INDERAL) 40 MG tablet Take 1 tablet (40 mg total) by mouth 2 (two) times daily. 180 tablet 3   topiramate (TOPAMAX) 25 MG tablet Take 1 tablet (25 mg total) by mouth daily as needed. 90 tablet 3    No current facility-administered medications for this visit.           Allergies as of 09/21/2022 - Review Complete 09/19/2022  Allergen Reaction Noted   Bee pollen Other (See Comments) 03/21/2017   Methylprednisolone Anxiety and Other (See Comments) 06/06/2022   Pollen extract   03/21/2017           Family History  Problem Relation Age of Onset   Hyperthyroidism Mother     Diabetes Mother     Asthma Father     Cataracts Sister     Strabismus Sister     Non-Hodgkin's lymphoma Brother     Cancer Brother     Colon cancer Neg Hx     Stomach cancer Neg Hx     Esophageal cancer Neg Hx     Colon polyps Neg Hx        Social History         Socioeconomic History   Marital status: Single      Spouse name: Not on file   Number of children: 0   Years of education: Not on file   Highest education level: Not on file  Occupational History   Occupation: Unemployed  Tobacco Use   Smoking status: Never       Passive exposure: Yes   Smokeless tobacco: Never   Tobacco comments:      family smokes outside  Vaping Use   Vaping Use: Never used  Substance and Sexual Activity   Alcohol use: No   Drug use: No   Sexual activity: Not on file  Other Topics Concern  Not on file  Social History Narrative    [employer] 22-23 school - lives with brother, sister, step father and mother.     Social Determinants of Health        Financial Resource Strain: Medium Risk (12/30/2021)    Overall Financial Resource Strain (CARDIA)     Difficulty of Paying Living Expenses: Somewhat hard  Food Insecurity: No Food Insecurity (12/30/2021)    Hunger Vital Sign     Worried About Running Out of Food in the Last Year: Never true     Ran Out of Food in the Last Year: Never true  Transportation Needs: Unmet Transportation Needs (03/06/2022)    PRAPARE - Armed forces logistics/support/administrative officer (Medical): Yes     Lack of Transportation (Non-Medical): Yes  Physical Activity: Not on file  Stress: Not on file  Social Connections: Not on file  Intimate Partner Violence: Not on file      Review of Systems:    Constitutional: No weight loss, fever or chills Cardiovascular: No chest pain Respiratory: No SOB Gastrointestinal: See HPI and otherwise negative    Physical Exam:  Vital signs: BP (!) 100/58   Pulse (!) 57   Ht 5' 3"$  (1.6 m)   Wt 232 lb 7 oz (105.4 kg)   BMI 41.17 kg/m     Constitutional:   Pleasant obese female appears to be in NAD, Well developed, Well nourished, alert and cooperative Respiratory: Respirations even and unlabored. Lungs clear to auscultation bilaterally.   No wheezes, crackles, or rhonchi.  Cardiovascular: Normal S1, S2. No MRG. Regular rate and rhythm. No peripheral edema, cyanosis or pallor.  Gastrointestinal:  Soft, nondistended, nontender. No rebound or guarding. Normal bowel sounds. No appreciable masses or hepatomegaly. Rectal:  Not performed.  Psychiatric:  Oriented to person, place and time. Demonstrates good judgement and reason without abnormal affect or behaviors.   RELEVANT LABS AND IMAGING: CBC Labs (Brief)          Component Value Date/Time    WBC 10.5 08/12/2022 1332    RBC 5.50 (H) 08/12/2022 1332    HGB 14.6 08/12/2022 1332    HCT 44.1 08/12/2022 1332    PLT 255 08/12/2022 1332    MCV 80.2 08/12/2022 1332    MCH 26.5 08/12/2022 1332    MCHC 33.1 08/12/2022 1332    RDW 13.6 08/12/2022 1332    LYMPHSABS 2.8 08/12/2022 1332    MONOABS 0.7 08/12/2022 1332    EOSABS 0.2 08/12/2022 1332    BASOSABS 0.0 08/12/2022 1332        CMP     Labs (Brief)          Component Value Date/Time    NA 139 08/12/2022 1332    K 4.3 08/12/2022 1332    CL 110 08/12/2022 1332    CO2 21 (L) 08/12/2022 1332    GLUCOSE 90 08/12/2022 1332    BUN 13 08/12/2022 1332    CREATININE 0.84 08/12/2022 1332    CREATININE 0.79 02/16/2022 1701    CALCIUM 9.3 08/12/2022 1332    PROT 7.9 08/12/2022 1332    ALBUMIN 3.9 08/12/2022 1332    AST 40 08/12/2022 1332    ALT 91 (H) 08/12/2022 1332    ALKPHOS 98 08/12/2022 1332    BILITOT 0.6 08/12/2022 1332    GFRNONAA >60 08/12/2022 1332    GFRAA NOT CALCULATED 03/21/2017 1856        Assessment: 1.  Epi pain  with N/V-likely functional, related anxiety- Extensive workup including EGD, ultrasound and HIDA scan all unrevealing; likely functional 2.  IBS-C 3. Fatty liver   Plan: 1.  Amitriptyline 10 mg QHS #30, 6RF 2.  Linzess 72 mcg po QD (samples given) 3.  Recommend the patient take prn mag cit 4.  Protonix 24m po QD 5.  Trial of gluten free diet 6.   FU in 12 weeks. If still with problems, GES to r/o gastroparesis followed by CTA (r/o any vascular abnormalities associated with Erler Danlos)  D/W mom over FPrince Frederick MD LVelora HecklerGI [phone]

## 2022-10-27 NOTE — Progress Notes (Signed)
Lab results mail it to pt home address.

## 2022-10-29 ENCOUNTER — Encounter: Payer: Self-pay | Admitting: Gastroenterology

## 2022-10-30 NOTE — Telephone Encounter (Signed)
Please refer to [facility] health nutritionist RG

## 2022-10-31 ENCOUNTER — Other Ambulatory Visit: Payer: Self-pay

## 2022-10-31 DIAGNOSIS — R1013 Epigastric pain: Secondary | ICD-10-CM

## 2022-10-31 DIAGNOSIS — K5900 Constipation, unspecified: Secondary | ICD-10-CM

## 2022-10-31 DIAGNOSIS — K760 Fatty (change of) liver, not elsewhere classified: Secondary | ICD-10-CM

## 2022-11-12 ENCOUNTER — Encounter: Payer: Self-pay | Admitting: Family

## 2022-11-12 ENCOUNTER — Encounter: Payer: Self-pay | Admitting: Gastroenterology

## 2022-11-12 ENCOUNTER — Encounter: Payer: Self-pay | Admitting: Cardiology

## 2022-11-13 ENCOUNTER — Other Ambulatory Visit: Payer: Self-pay | Admitting: Family

## 2022-11-13 DIAGNOSIS — N946 Dysmenorrhea, unspecified: Secondary | ICD-10-CM

## 2022-11-16 NOTE — Telephone Encounter (Signed)
Pt was made aware of Dr. Lyndel Safe recommendations.  Letter created for pt. Pt to pick up letter on 2nd floor. Pt verbalized understanding with all questions answered.

## 2022-11-16 NOTE — Telephone Encounter (Signed)
Please go ahead and give her a letter RG

## 2022-12-06 ENCOUNTER — Encounter: Payer: Self-pay | Admitting: Neurology

## 2022-12-07 ENCOUNTER — Emergency Department (HOSPITAL_COMMUNITY)
Admission: EM | Admit: 2022-12-07 | Discharge: 2022-12-07 | Disposition: A | Discharge status: Home / Self Care | Payer: Medicaid Other | Attending: Emergency Medicine | Admitting: Emergency Medicine

## 2022-12-07 ENCOUNTER — Encounter (HOSPITAL_COMMUNITY): Payer: Self-pay

## 2022-12-07 ENCOUNTER — Other Ambulatory Visit: Payer: Self-pay

## 2022-12-07 DIAGNOSIS — R519 Headache, unspecified: Secondary | ICD-10-CM | POA: Insufficient documentation

## 2022-12-07 DIAGNOSIS — H538 Other visual disturbances: Secondary | ICD-10-CM | POA: Insufficient documentation

## 2022-12-07 DIAGNOSIS — H539 Unspecified visual disturbance: Secondary | ICD-10-CM

## 2022-12-07 LAB — BASIC METABOLIC PANEL
Anion gap: 6 (ref 5–15)
BUN: 10 mg/dL (ref 6–20)
CO2: 26 mmol/L (ref 22–32)
Calcium: 9.4 mg/dL (ref 8.9–10.3)
Chloride: 103 mmol/L (ref 98–111)
Creatinine, Ser: 0.73 mg/dL (ref 0.44–1.00)
GFR, Estimated: >60 mL/min (ref >60)
Glucose, Bld: 80 mg/dL (ref 70–99)
Potassium: 3.5 mmol/L (ref 3.5–5.1)
Sodium: 135 mmol/L (ref 135–145)

## 2022-12-07 LAB — CBC
HCT: 39.6 % (ref 36.0–46.0)
Hemoglobin: 13.4 g/dL (ref 12.0–15.0)
MCH: 27.1 pg (ref 26.0–34.0)
MCHC: 33.8 g/dL (ref 30.0–36.0)
MCV: 80.2 fL (ref 80.0–100.0)
Platelets: 238 10*3/uL (ref 150–400)
RBC: 4.94 MIL/uL (ref 3.87–5.11)
RDW: 12.8 % (ref 11.5–15.5)
WBC: 8 10*3/uL (ref 4.0–10.5)
nRBC: 0 % (ref 0.0–0.2)

## 2022-12-07 MED ORDER — METOCLOPRAMIDE HCL 5 MG/ML IJ SOLN: 5.0000 mg | Freq: Once | INTRAMUSCULAR | Status: DC

## 2022-12-07 MED ORDER — KETOROLAC TROMETHAMINE 15 MG/ML IJ SOLN
15.0000 mg | Freq: Once | INTRAMUSCULAR | Status: AC
  Administered 2022-12-07: 15 mg via INTRAVENOUS
  Filled 2022-12-07: qty 1

## 2022-12-07 MED ORDER — METOCLOPRAMIDE HCL 5 MG/ML IJ SOLN
10.0000 mg | Freq: Once | INTRAMUSCULAR | Status: AC
  Administered 2022-12-07: 10 mg via INTRAVENOUS
  Filled 2022-12-07: qty 2

## 2022-12-07 MED ORDER — DIPHENHYDRAMINE HCL 50 MG/ML IJ SOLN
12.5000 mg | Freq: Once | INTRAMUSCULAR | Status: AC
  Administered 2022-12-07: 12.5 mg via INTRAVENOUS
  Filled 2022-12-07: qty 1

## 2022-12-07 NOTE — ED Notes (Signed)
Patient ambulatory to bathroom with steady gait.

## 2022-12-07 NOTE — Telephone Encounter (Signed)
Pt was advised to go to the ER  by Lazaro Arms, due to vision changes and dizziness along with vomiting developing. Pt advised she has a knot on the back of her head and not sure of injury.

## 2022-12-07 NOTE — Discharge Instructions (Signed)
If you have new or worsening changes in vision, loss of vision, severe vomiting, or any strokelike symptoms, please return to the ER.

## 2022-12-07 NOTE — ED Notes (Signed)
Visual Acuity: Left eye  20/70       Right eye  20/50

## 2022-12-07 NOTE — ED Triage Notes (Addendum)
Patient said she has a headache, blurred vision out of left eye and is feeling dizzy for 2 weeks. Also vomiting with left lower abdominal pain.

## 2022-12-07 NOTE — ED Provider Notes (Signed)
[place] Provider Note   CSN: [id number] Arrival date & time: 12/07/22  1631     History  Chief Complaint  Patient presents with   Headache    Alexandra [name] is a 22 y.o. 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  She reports is been ongoing chronically for months.  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  She is not able to see a neurologist until August.  Her PCP put her on Topamax but this has not helped her headache.  Neither does Tylenol and ibuprofen.  She has headaches on a near daily basis, sometimes when she wakes up, but not always.  This headaches are often associated with blurred vision in her left eye specifically.  She does not wear contacts but she does wear glasses.  External record review shows patient an MRI of the brain with and without contrast in 2022 which showed a pineal cyst, felt to be likely incidental, but repeat imaging was recommended.  She was seen by Dr Zada Finders from [place] on 11/30/22 for this issue, noted to be having spurs in her visual field, retro-orbital headache, near daily basis, with failure to improve with over-the-counter migraine medications.  She was also seen in the emergency department abdominal pain in the past, including in August 2023 where she had a CT scan of the abdomen showing potential mesenteric adenitis but no other emergent findings.  She has had since then HIDA scan and gallbladder ultrasound which were unremarkable, in Dec 2023 and Jan 2024  HPI     Home Medications Prior to Admission medications   Medication Sig Start Date End Date Taking? Authorizing Provider  amitriptyline (ELAVIL) 10 MG tablet Take 1 tablet (10 mg total) by mouth at  bedtime. 10/26/22   [name], MD  atomoxetine (STRATTERA) 18 MG capsule Take 18 mg by mouth daily. 06/13/22   [provider]  Clindamycin-Benzoyl Per, Refr, gel Apply topically twice daily to clean face. 10/19/22   Parthenia Ames, NP  FLUoxetine (PROZAC) 40 MG capsule Take 40 mg by mouth daily. 06/13/22   [provider]  hydrOXYzine (ATARAX) 25 MG tablet Take 25 mg by mouth daily at 6 (six) AM. Pt takes 1 table once a day.    [provider]  hydrOXYzine (ATARAX) 50 MG tablet Take 1 tablet (50 mg total) by mouth at bedtime. 06/13/22   Parthenia Ames, NP  lidocaine (LIDODERM) 5 % Place 1 patch onto the skin daily. Remove & Discard patch within 12 hours or as directed by MD 08/04/22   Callie Fielding, MD  linaclotide [facility]) 72 MCG capsule Take 1 capsule (72 mcg total) by mouth daily before breakfast. 10/26/22   [name], MD  ondansetron (ZOFRAN-ODT) 8 MG disintegrating tablet Take 1 tablet (8 mg total) by mouth every 8 (eight) hours as needed for nausea or vomiting. 09/01/22   Levin Erp, PA  pantoprazole (PROTONIX) 40 MG tablet Take 1 tablet by mouth 30 minutes before breakfast and evening meal Patient not taking: Reported on 10/26/2022 06/28/22   Levin Erp, PA  pantoprazole (PROTONIX) 40 MG tablet Take 1 tablet (40 mg total) by mouth daily. 10/26/22   [name], MD  Prenatal Vit-Fe Fumarate-FA (PRENATAL VITAMIN PLUS LOW IRON) 27-1 MG TABS Take 1 tablet by mouth daily at 12 noon. 10/13/21   Trude Mcburney, FNP  propranolol (INDERAL) 40 MG tablet Take 1 tablet (40 mg total) by mouth 2 (two) times daily. 07/04/22   Imogene Burn, PA-C  topiramate (TOPAMAX) 25 MG tablet Take 1 tablet (25 mg total) by mouth daily as needed. 06/06/22   Raulkar, Clide Deutscher, MD      Allergies    Bee pollen, Methylprednisolone, and Pollen extract    Review of Systems   Review of Systems  Physical Exam Updated Vital Signs BP (!) 102/55   Pulse  71   Temp 98.9 F (37.2 C) (Oral)   Resp 17   Ht 5\' 4"  (1.626 m)   Wt 106.6 kg   SpO2 100%   BMI 40.34 kg/m  Physical Exam Constitutional:      General: She is not in acute distress. HENT:     Head: Normocephalic and atraumatic.  Eyes:     Conjunctiva/sclera: Conjunctivae normal.     Pupils: Pupils are equal, round, and reactive to light.  Cardiovascular:     Rate and Rhythm: Normal rate and regular rhythm.  Pulmonary:     Effort: Pulmonary effort is normal. No respiratory distress.  Abdominal:     General: There is no distension.     Tenderness: There is no abdominal tenderness.  Skin:    General: Skin is warm and dry.  Neurological:     General: No focal deficit present.     Mental Status: She is alert. Mental status is at baseline.     Comments: Full extraocular motion movement noted bilaterally in the eyes, pupils are equally reactive to light.  Peripheral fields are intact.  Patient reports some sensation of blurred vision or double vision with left eye isolated visual testing.  Conjunctiva is quiet, does not appear injected.  Psychiatric:        Mood and Affect: Mood normal.        Behavior: Behavior normal.     ED Results / Procedures / Treatments   Labs (all labs ordered are listed, but only abnormal results are displayed) Labs Reviewed  BASIC METABOLIC PANEL  CBC  I-STAT BETA HCG BLOOD, ED ([facility], [facility], [facility] ONLY)    EKG None  Radiology No results found.  Procedures Procedures    Medications Ordered in ED Medications  diphenhydrAMINE (BENADRYL) injection 12.5 mg (12.5 mg Intravenous Given 12/07/22 1750)  ketorolac (TORADOL) 15 MG/ML injection 15 mg (15 mg Intravenous Given 12/07/22 1751)  metoCLOPramide (REGLAN) injection 10 mg (10 mg Intravenous Given 12/07/22 1750)    ED Course/ Medical Decision Making/ A&P Clinical Course as of 12/07/22 2054  Thu Dec 07, 2022  2031 Patient was reassessed as she reports to me that she no longer wants to wait for MRI  scan.  She says her headache feels significantly better with migraine medications and her vision is improved back to normal.  I suspect therefore that this may be a complex migraine, although it is quite unusual this has been ongoing for several days or months, and I do recommend still that she follows up with a neurologist.  I advise she also reach out to her PCP about repeating the MRI of the brain as an outpatient if she does not want to do this in the ER.  She verbalized understanding.  She is asking for referral to [facility] neurology, she is looking  for a second opinion after seeing [place] neurology several years ago. [MT]    Clinical Course User Index [MT] Tabithia Stroder, Carola Rhine, MD                             Medical Decision Making Amount and/or Complexity of Data Reviewed Labs: ordered. Radiology: ordered.  Risk Prescription drug management.   This patient presents to the ED with concern for headache, blurred vision. This involves an extensive number of treatment options, and is a complaint that carries with it a high risk of complications and morbidity.  The differential diagnosis includes complex migraine versus intracranial mass versus occipital lesion versus demyelinating condition versus other  I have a lower suspicion for intraocular process as a cause of her headaches, specifically acute angle-closure glaucoma or retinal detachment.  She does have nausea and occasional vomiting associated with her headaches, but have a low suspicion for subarachnoid hemorrhage or meningitis.  No indication for lumbar puncture at this time.  Low suspicion for intra-abdominal infection or emergent process to require abdominal imaging.  She has had unremarkable abdominal imaging performed over the past year including CT scan and ultrasound.  External records from outside source obtained and reviewed including gastroenterology and neurosurgery evaluation  I ordered and personally interpreted labs.   The pertinent results include: No emergent findings on blood work  I ordered imaging studies including MRI of the brain with and without contrast.   The patient subsequently chose to leave prior to completing MRI scan of the brain.  Please see ED course  I ordered medication including IV medications for migraine headache  I have reviewed the patients home medicines and have made adjustments as needed   After the interventions noted above, I reevaluated the patient and found that they have: improved   Dispostion:  After consideration of the diagnostic results and the patients response to treatment, I feel that the patent would benefit from outpatient specialist follow-up         Final Clinical Impression(s) / ED Diagnoses Final diagnoses:  Nonintractable headache, unspecified chronicity pattern, unspecified headache type  Change in vision    Rx / DC Orders ED Discharge Orders          Ordered    Ambulatory referral to Neurology       Comments: An appointment is requested in approximately: 2 weeks Potential complex migraine   12/07/22 2032              Wyvonnia Dusky, MD 12/07/22 2054

## 2022-12-08 ENCOUNTER — Telehealth: Payer: Self-pay

## 2022-12-08 NOTE — Transitions of Care (Post Inpatient/ED Visit) (Signed)
   12/08/2022  Name: Alexandra [name] MRN: [id number] DOB: 2001/09/01  Today's TOC FU Call Status: Today's TOC FU Call Status:: Unsuccessul Call (1st Attempt) Unsuccessful Call (1st Attempt) Date: 12/08/22  Attempted to reach the patient regarding the most recent Inpatient/ED visit.  Follow Up Plan: Additional outreach attempts will be made to reach the patient to complete the Transitions of Care (Post Inpatient/ED visit) call.   Signature Renelda Loma RMA

## 2022-12-10 ENCOUNTER — Encounter: Payer: Self-pay | Admitting: Gastroenterology

## 2022-12-11 ENCOUNTER — Telehealth: Payer: Self-pay

## 2022-12-11 ENCOUNTER — Encounter: Payer: Self-pay | Admitting: Family

## 2022-12-11 NOTE — Telephone Encounter (Signed)
Pt was called and she will keep the 12/14/22 appointment . We will address the MRI at that time [facility]

## 2022-12-11 NOTE — Transitions of Care (Post Inpatient/ED Visit) (Cosign Needed)
   12/11/2022  Name: Alexandra [name] MRN: [id number] DOB: [birth year]  Today's TOC FU Call Status: Today's TOC FU Call Status:: Successful TOC FU Call Competed TOC FU Call Complete Date: 12/11/22  Transition Care Management Follow-up Telephone Call Date of Discharge: 12/07/22 Discharge Facility: [facility]) Type of Discharge: Emergency Department Reason for ED Visit: Other:, Neurologic How have you been since you were released from the hospital?: Better Any questions or concerns?: No  Items Reviewed: Did you receive and understand the discharge instructions provided?: No Medications obtained and verified?: Yes (Medications Reviewed) Any new allergies since your discharge?: No Dietary orders reviewed?: NA Do you have support at home?: Yes People in Home: significant other, other relative(s)  Home Care and Equipment/Supplies: Were Home Health Services Ordered?: NA Any new equipment or medical supplies ordered?: NA  Functional Questionnaire: Do you need assistance with bathing/showering or dressing?: No Do you need assistance with meal preparation?: No Do you need assistance with eating?: No Do you have difficulty maintaining continence: No Do you need assistance with getting out of bed/getting out of a chair/moving?: No Do you have difficulty managing or taking your medications?: No  Follow up appointments reviewed: PCP Follow-up appointment confirmed?: Yes Date of PCP follow-up appointment?: 12/14/22 Follow-up Provider: Angus Seller Specialist [facility] Follow-up appointment confirmed?: NA Do you need transportation to your follow-up appointment?: No Do you understand care options if your condition(s) worsen?: Yes-patient verbalized understanding    SIGNATURE Renelda Loma RMA

## 2022-12-13 ENCOUNTER — Other Ambulatory Visit: Payer: Self-pay | Admitting: Nurse Practitioner

## 2022-12-13 MED ORDER — SUMATRIPTAN SUCCINATE 25 MG PO TABS: 25.0000 mg | ORAL_TABLET | ORAL | 0 refills | Status: DC | PRN

## 2022-12-13 MED ORDER — ONDANSETRON HCL 4 MG PO TABS: 4.0000 mg | ORAL_TABLET | Freq: Three times a day (TID) | ORAL | 0 refills | Status: DC | PRN

## 2022-12-14 ENCOUNTER — Ambulatory Visit (INDEPENDENT_AMBULATORY_CARE_PROVIDER_SITE_OTHER): Payer: Medicaid Other | Admitting: Nurse Practitioner

## 2022-12-14 ENCOUNTER — Encounter: Payer: Self-pay | Admitting: Nurse Practitioner

## 2022-12-14 VITALS — BP 92/56 | HR 75 | Temp 98.2°F | Wt 234.8 lb

## 2022-12-14 DIAGNOSIS — G8929 Other chronic pain: Secondary | ICD-10-CM

## 2022-12-14 DIAGNOSIS — R519 Headache, unspecified: Secondary | ICD-10-CM | POA: Diagnosis not present

## 2022-12-14 MED ORDER — BUTALBITAL-APAP-CAFFEINE 50-325-40 MG PO TABS
1.0000 | ORAL_TABLET | Freq: Four times a day (QID) | ORAL | 0 refills | Status: DC | PRN
Start: 2022-12-14 — End: 2022-12-28

## 2022-12-14 MED ORDER — CYCLOBENZAPRINE HCL 10 MG PO TABS
10.0000 mg | ORAL_TABLET | Freq: Three times a day (TID) | ORAL | 0 refills | Status: DC | PRN
Start: 2022-12-14 — End: 2023-01-01

## 2022-12-14 NOTE — Patient Instructions (Addendum)
1. Chronic nonintractable headache, unspecified headache type  - MR Brain W Wo Contrast; Future - butalbital-acetaminophen-caffeine (FIORICET) 50-325-40 MG tablet; Take 1-2 tablets by mouth every 6 (six) hours as needed for headache.  Dispense: 20 tablet; Refill: 0 - cyclobenzaprine (FLEXERIL) 10 MG tablet; Take 1 tablet (10 mg total) by mouth 3 (three) times daily as needed for muscle spasms.  Dispense: 30 tablet; Refill: 0  -keep upcoming visit with neurology   Follow up:  Follow up in 3 months

## 2022-12-14 NOTE — Progress Notes (Signed)
@Patient  ID: Alexandra [name], female    DOB: Sep 10, 2000, 22 y.o.   MRN: [id number]  Chief Complaint  Patient presents with   Hospitalization Follow-up    Headaches and vomiting     Referring provider: Ivonne Andrew, NP   HPI  22 year old female with history of POTS, hepatomegaly, GERD Ehlers-Danlos syndrome, depression, anxiety ADHD, abdominal pain, constipation, insomnia.    Patient presents today for ED follow-up.  She was seen in the ED on 12/07/2022 for headaches.  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  She was sent to neurosurgeon by previous PCP by neurology.  We have placed a referral for neurology for her and she does have appointment scheduled in May.  She was ordered an MRI in the emergency room but left without having this done.  We will reorder this today. Denies f/c/s, n/v/d, hemoptysis, PND, leg swelling Denies chest pain or edema     Allergies  Allergen Reactions   Bee Pollen Other (See Comments)    "seasonal allergies"   Methylprednisolone Anxiety and Other (See Comments)    Became angry and had a "weird feeling"   Pollen Extract     "seasonal allergies"    Immunization History  Administered Date(s) Administered   Influenza,inj,Quad PF,6+ Mos 07/21/2019, 05/31/2021    Past Medical History:  Diagnosis Date   Anxiety    Dry skin    Eating disorder    Ehlers-Danlos disease    Fatty liver    POTS (postural orthostatic tachycardia syndrome)    Vision abnormalities     Tobacco History: Social History   Tobacco Use  Smoking Status Never   Passive exposure: Yes  Smokeless Tobacco Never  Tobacco Comments   family smokes outside   Counseling given: Not Answered Tobacco comments: family smokes outside   Outpatient Encounter Medications as of 12/14/2022  Medication Sig   amitriptyline (ELAVIL) 10 MG tablet Take 1 tablet (10 mg total) by mouth at bedtime.   atomoxetine (STRATTERA) 18 MG capsule Take 18 mg by mouth  daily.   butalbital-acetaminophen-caffeine (FIORICET) 50-325-40 MG tablet Take 1-2 tablets by mouth every 6 (six) hours as needed for headache.   cyclobenzaprine (FLEXERIL) 10 MG tablet Take 1 tablet (10 mg total) by mouth 3 (three) times daily as needed for muscle spasms.   FLUoxetine (PROZAC) 40 MG capsule Take 40 mg by mouth daily.   hydrOXYzine (ATARAX) 25 MG tablet Take 25 mg by mouth daily at 6 (six) AM. Pt takes 1 table once a day.   hydrOXYzine (ATARAX) 50 MG tablet Take 1 tablet (50 mg total) by mouth at bedtime.   linaclotide (LINZESS) 72 MCG capsule Take 1 capsule (72 mcg total) by mouth daily before breakfast.   ondansetron (ZOFRAN-ODT) 8 MG disintegrating tablet Take 1 tablet (8 mg total) by mouth every 8 (eight) hours as needed for nausea or vomiting.   pantoprazole (PROTONIX) 40 MG tablet Take 1 tablet (40 mg total) by mouth daily.   Prenatal Vit-Fe Fumarate-FA (PRENATAL VITAMIN PLUS LOW IRON) 27-1 MG TABS Take 1 tablet by mouth daily at 12 noon.   propranolol (INDERAL) 40 MG tablet Take 1 tablet (40 mg total) by mouth 2 (two) times daily.   SUMAtriptan (IMITREX) 25 MG tablet Take 1 tablet (25 mg total) by mouth every 2 (two) hours as needed for migraine. May repeat in 2 hours if headache persists or recurs.   topiramate (TOPAMAX) 25 MG tablet Take 1 tablet (25 mg  total) by mouth daily as needed.   Clindamycin-Benzoyl Per, Refr, gel Apply topically twice daily to clean face. (Patient not taking: Reported on 12/14/2022)   lidocaine (LIDODERM) 5 % Place 1 patch onto the skin daily. Remove & Discard patch within 12 hours or as directed by MD (Patient not taking: Reported on 12/14/2022)   ondansetron (ZOFRAN) 4 MG tablet Take 1 tablet (4 mg total) by mouth every 8 (eight) hours as needed for nausea or vomiting. (Patient not taking: Reported on 12/14/2022)   pantoprazole (PROTONIX) 40 MG tablet Take 1 tablet by mouth 30 minutes before breakfast and evening meal (Patient not taking: Reported on  10/26/2022)   No facility-administered encounter medications on file as of 12/14/2022.     Review of Systems  Review of Systems  Constitutional: Negative.   HENT: Negative.    Cardiovascular: Negative.   Gastrointestinal: Negative.   Allergic/Immunologic: Negative.   Neurological:  Positive for headaches.  Psychiatric/Behavioral: Negative.         Physical Exam  BP (!) 92/56   Pulse 75   Temp 98.2 F (36.8 C)   Wt 234 lb 12.8 oz (106.5 kg)   SpO2 99%   BMI 40.30 kg/m   Wt Readings from Last 5 Encounters:  12/14/22 234 lb 12.8 oz (106.5 kg) (>99 %, Z= 2.36)*  12/07/22 235 lb (106.6 kg) (>99 %, Z= 2.37)*  10/26/22 230 lb 6 oz (104.5 kg) (99 %, Z= 2.32)*  10/25/22 229 lb 3.2 oz (104 kg) (99 %, Z= 2.31)*  09/21/22 232 lb 7 oz (105.4 kg) (>99 %, Z= 2.34)*   * Growth percentiles are based on CDC (Girls, 2-20 Years) data.     Physical Exam Vitals and nursing note reviewed.  Constitutional:      General: She is not in acute distress.    Appearance: She is well-developed.  Cardiovascular:     Rate and Rhythm: Normal rate and regular rhythm.  Pulmonary:     Effort: Pulmonary effort is normal.     Breath sounds: Normal breath sounds.  Neurological:     Mental Status: She is alert and oriented to person, place, and time.      Lab Results:  CBC    Component Value Date/Time   WBC 8.0 12/07/2022 1756   RBC 4.94 12/07/2022 1756   HGB 13.4 12/07/2022 1756   HGB 14.4 10/25/2022 1442   HCT 39.6 12/07/2022 1756   HCT 42.7 10/25/2022 1442   PLT 238 12/07/2022 1756   PLT 244 10/25/2022 1442   MCV 80.2 12/07/2022 1756   MCV 81 10/25/2022 1442   MCH 27.1 12/07/2022 1756   MCHC 33.8 12/07/2022 1756   RDW 12.8 12/07/2022 1756   RDW 13.6 10/25/2022 1442   LYMPHSABS 2.8 08/12/2022 1332   MONOABS 0.7 08/12/2022 1332   EOSABS 0.2 08/12/2022 1332   BASOSABS 0.0 08/12/2022 1332    BMET    Component Value Date/Time   NA 135 12/07/2022 1756   NA 142 10/25/2022 1442    K 3.5 12/07/2022 1756   CL 103 12/07/2022 1756   CO2 26 12/07/2022 1756   GLUCOSE 80 12/07/2022 1756   BUN 10 12/07/2022 1756   BUN 11 10/25/2022 1442   CREATININE 0.73 12/07/2022 1756   CREATININE 0.79 02/16/2022 1701   CALCIUM 9.4 12/07/2022 1756   GFRNONAA >60 12/07/2022 1756   GFRAA NOT CALCULATED 03/21/2017 1856      Assessment & Plan:   Chronic nonintractable headache - MR Brain  W Wo Contrast; Future - butalbital-acetaminophen-caffeine (FIORICET) 50-325-40 MG tablet; Take 1-2 tablets by mouth every 6 (six) hours as needed for headache.  Dispense: 20 tablet; Refill: 0 - cyclobenzaprine (FLEXERIL) 10 MG tablet; Take 1 tablet (10 mg total) by mouth 3 (three) times daily as needed for muscle spasms.  Dispense: 30 tablet; Refill: 0  -keep upcoming visit with neurology   Follow up:  Follow up in 3 months     Ivonne Andrew, NP 12/14/2022

## 2022-12-14 NOTE — Assessment & Plan Note (Signed)
-   MR Brain W Wo Contrast; Future - butalbital-acetaminophen-caffeine (FIORICET) 50-325-40 MG tablet; Take 1-2 tablets by mouth every 6 (six) hours as needed for headache.  Dispense: 20 tablet; Refill: 0 - cyclobenzaprine (FLEXERIL) 10 MG tablet; Take 1 tablet (10 mg total) by mouth 3 (three) times daily as needed for muscle spasms.  Dispense: 30 tablet; Refill: 0  -keep upcoming visit with neurology   Follow up:  Follow up in 3 months

## 2022-12-14 NOTE — Telephone Encounter (Signed)
Had an appointment today. Kh

## 2022-12-18 ENCOUNTER — Ambulatory Visit: Payer: Medicaid Other | Admitting: Registered"

## 2022-12-20 ENCOUNTER — Ambulatory Visit (HOSPITAL_COMMUNITY)
Admission: RE | Admit: 2022-12-20 | Discharge: 2022-12-20 | Disposition: A | Discharge status: Home / Self Care | Payer: Medicaid Other | Source: Ambulatory Visit | Attending: Nurse Practitioner | Admitting: Nurse Practitioner

## 2022-12-20 DIAGNOSIS — R519 Headache, unspecified: Secondary | ICD-10-CM | POA: Diagnosis present

## 2022-12-20 DIAGNOSIS — G8929 Other chronic pain: Secondary | ICD-10-CM | POA: Insufficient documentation

## 2022-12-20 MED ORDER — GADOBUTROL 1 MMOL/ML IV SOLN
10.0000 mL | Freq: Once | INTRAVENOUS | Status: AC | PRN
  Administered 2022-12-20: 10 mL via INTRAVENOUS

## 2022-12-25 ENCOUNTER — Other Ambulatory Visit: Payer: Self-pay | Admitting: Nurse Practitioner

## 2022-12-27 NOTE — Progress Notes (Unsigned)
NEUROLOGY CONSULTATION NOTE  Alexandra [name] MRN: [id number] DOB: 17-Mar-2001  Referring provider: Autumn Patty, MD Primary care provider: Angus Seller, NP  Reason for consult:  headache  Assessment/Plan:   Migraine without aura, with status migrainosus, not intractable, suspect cervicogenic component Cervicalgia   Refer to physical therapy for neck pain Migraine prevention:  restart topiramate at  but to take every night at bedtime.  We can increase to  in 4 weeks if needed Migraine rescue:  start rizatriptan  with Zofran.  Stop Fioricet May take cyclobenzaprine. Limit use of pain relievers to no more than 2 days out of week to prevent risk of rebound or medication-overuse headache. Keep headache diary Follow up 6 months.    Subjective:  Alexandra [name] is a 22 year old female with Ehlers-Danlos disease, POTS and anxiety who presents for headaches.  History supplemented by referring provider's and primary care's notes.  Onset:  Prior history of frequent headaches in 2022.  Subsequently resolved.  Restarted 4 to 5 weeks ago.  Gradually getting worse Location:  Left occipital and then radiates forward and across forehead.  Quality:  squeezing, pounding Intensity:  3-9/10.  Aura:  absent Prodrome:  absent Associated symptoms:  Left eye blurred vision, nausea, vomiting.  She denies associated photophobia, phonophobia, unilateral numbness or weakness. Duration:  several hours (severe 1-3 hours) Frequency:  daily Frequency of abortive medication: Fioricet and cyclobenzaprine daily for past week. 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  Otherwise, brain looks normal.  Followed by neurosurgery.  Finding unlikely to be cause of headaches.  Due  to increased frequency and severity of headaches, she had a repeat MRI of brain with and without contrast on 12/20/2022 which was personally reviewed revealed stable 7 mm pineal cyst but otherwise unremarkable.    Had a routine eye exam 2 months ago (prior to onset) which was fine  Past NSAIDS/analgesics:  ketorolac, ibuprofen, acetaminophen Past abortive triptans:  sumatriptan tab (caused head burning) Past abortive ergotamine:  none Past muscle relaxants:  tizanidine Past anti-emetic:  none Past antihypertensive medications:  metoprolol Past antidepressant medications:  venlafaxine, imipramine, desipramine Past anticonvulsant medications:  topiramate  PRN (for pain) Past anti-CGRP:  none Past vitamins/Herbal/Supplements:  magnesium oxide Past antihistamines/decongestants:  Zyrtec, Benadryl Other past therapies:  none  Current NSAIDS/analgesics:  Fioricet Current triptans:  none Current ergotamine:  none Current anti-emetic:  Zofran  Current muscle relaxants:  Flexeril  TID PRN Current Antihypertensive medications:  propranolol  BID Current Antidepressant medications:  amitriptyline  QHS (for abdominal spasms - higher doses caused dizziness), fluoxetine  daily Current Anticonvulsant medications:  none Current anti-CGRP:  none Current Vitamins/Herbal/Supplements:  none Current Antihistamines/Decongestants:  none Other therapy:  none Birth control:  none Other medications:  Strattera, hydroxyzine   Caffeine:  avoids due to palpitations Diet:  drinks a lot of water Depression:  stable; Anxiety:  stable Other pain:  EDS, neck pain Sleep hygiene:  usually okay Family history of headache:  no      PAST MEDICAL HISTORY: Past Medical History:  Diagnosis Date   Anxiety    Dry skin    Eating disorder    Ehlers-Danlos disease    Fatty liver    POTS (postural orthostatic tachycardia syndrome)    Vision abnormalities     PAST SURGICAL  HISTORY: Past  Surgical History:  Procedure Laterality Date   DENTAL SURGERY     TYMPANOSTOMY TUBE PLACEMENT      MEDICATIONS: Current Outpatient Medications on File Prior to Visit  Medication Sig Dispense Refill   amitriptyline (ELAVIL) 10 MG tablet Take 1 tablet (10 mg total) by mouth at bedtime. 30 tablet 6   atomoxetine (STRATTERA) 18 MG capsule Take 18 mg by mouth daily.     butalbital-acetaminophen-caffeine (FIORICET) 50-325-40 MG tablet Take 1-2 tablets by mouth every 6 (six) hours as needed for headache. 20 tablet 0   Clindamycin-Benzoyl Per, Refr, gel Apply topically twice daily to clean face. (Patient not taking: Reported on 12/14/2022) 45 g 0   cyclobenzaprine (FLEXERIL) 10 MG tablet Take 1 tablet (10 mg total) by mouth 3 (three) times daily as needed for muscle spasms. 30 tablet 0   FLUoxetine (PROZAC) 40 MG capsule Take 40 mg by mouth daily.     hydrOXYzine (ATARAX) 25 MG tablet Take 25 mg by mouth daily at 6 (six) AM. Pt takes 1 table once a day.     hydrOXYzine (ATARAX) 50 MG tablet Take 1 tablet (50 mg total) by mouth at bedtime. 30 tablet 0   lidocaine (LIDODERM) 5 % Place 1 patch onto the skin daily. Remove & Discard patch within 12 hours or as directed by MD (Patient not taking: Reported on 12/14/2022) 30 patch 0   linaclotide (LINZESS) 72 MCG capsule Take 1 capsule (72 mcg total) by mouth daily before breakfast. 30 capsule 2   ondansetron (ZOFRAN) 4 MG tablet Take 1 tablet (4 mg total) by mouth every 8 (eight) hours as needed for nausea or vomiting. (Patient not taking: Reported on 12/14/2022) 20 tablet 0   ondansetron (ZOFRAN-ODT) 8 MG disintegrating tablet Take 1 tablet (8 mg total) by mouth every 8 (eight) hours as needed for nausea or vomiting. 30 tablet 0   pantoprazole (PROTONIX) 40 MG tablet Take 1 tablet by mouth 30 minutes before breakfast and evening meal (Patient not taking: Reported on 10/26/2022) 60 tablet 2   pantoprazole (PROTONIX) 40 MG tablet Take 1 tablet (40 mg total) by  mouth daily. 30 tablet 4   Prenatal Vit-Fe Fumarate-FA (PRENATAL VITAMIN PLUS LOW IRON) 27-1 MG TABS Take 1 tablet by mouth daily at 12 noon. 90 tablet 3   propranolol (INDERAL) 40 MG tablet Take 1 tablet (40 mg total) by mouth 2 (two) times daily. 180 tablet 3   SUMAtriptan (IMITREX) 25 MG tablet Take 1 tablet (25 mg total) by mouth every 2 (two) hours as needed for migraine. May repeat in 2 hours if headache persists or recurs. 10 tablet 0   topiramate (TOPAMAX) 25 MG tablet Take 1 tablet (25 mg total) by mouth daily as needed. 90 tablet 3   No current facility-administered medications on file prior to visit.    ALLERGIES: Allergies  Allergen Reactions   Bee Pollen Other (See Comments)    "seasonal allergies"   Methylprednisolone Anxiety and Other (See Comments)    Became angry and had a "weird feeling"   Pollen Extract     "seasonal allergies"    FAMILY HISTORY: Family History  Problem Relation Age of Onset   Hyperthyroidism Mother    Diabetes Mother    Asthma Father    Cataracts Sister    Strabismus Sister    Non-Hodgkin's lymphoma Brother    Cancer Brother    Colon cancer Neg Hx    Stomach cancer Neg Hx  Esophageal cancer Neg Hx    Colon polyps Neg Hx     Objective:  Blood pressure 97/66, pulse 82, height 5\' 4"  (1.626 m), weight 237 lb 9.6 oz (107.8 kg), SpO2 97 %. General: No acute distress.  Patient appears well-groomed.   Head:  Normocephalic/atraumatic Eyes:  fundi examined but not visualized Neck: supple, no paraspinal tenderness, full range of motion Back: No paraspinal tenderness Heart: regular rate and rhythm Lungs: Clear to auscultation bilaterally. Vascular: No carotid bruits. Neurological Exam: Mental status: alert and oriented to person, place, and time, speech fluent and not dysarthric, language intact. Cranial nerves: CN I: not tested CN II: pupils equal, round and reactive to light, visual fields intact CN III, IV, VI:  full range of motion, no  nystagmus, no ptosis CN V: endorses hyperesthesia of left V2-V3 CN VII: upper and lower face symmetric CN VIII: hearing intact CN IX, X: gag intact, uvula midline CN XI: sternocleidomastoid and trapezius muscles intact CN XII: tongue midline Bulk & Tone: normal, no fasciculations. Motor:  muscle strength 5/5 throughout Sensation:  Pinprick, temperature and vibratory sensation intact. Deep Tendon Reflexes:  2+ throughout,  toes downgoing.   Finger to nose testing:  Without dysmetria.   Heel to shin:  Without dysmetria.   Gait:  Normal station and stride.  Romberg negative.    Thank you for allowing me to take part in the care of this patient.  Shon Millet, DO

## 2022-12-28 ENCOUNTER — Ambulatory Visit (INDEPENDENT_AMBULATORY_CARE_PROVIDER_SITE_OTHER): Payer: Medicaid Other | Admitting: Neurology

## 2022-12-28 ENCOUNTER — Encounter: Payer: Self-pay | Admitting: Neurology

## 2022-12-28 VITALS — BP 97/66 | HR 82 | Ht 64.0 in | Wt 237.6 lb

## 2022-12-28 DIAGNOSIS — G43001 Migraine without aura, not intractable, with status migrainosus: Secondary | ICD-10-CM

## 2022-12-28 DIAGNOSIS — M542 Cervicalgia: Secondary | ICD-10-CM

## 2022-12-28 MED ORDER — RIZATRIPTAN BENZOATE 10 MG PO TABS: 10.0000 mg | ORAL_TABLET | ORAL | 5 refills | Status: DC | PRN

## 2022-12-28 MED ORDER — TOPIRAMATE 25 MG PO TABS
25.0000 mg | ORAL_TABLET | Freq: Every day | ORAL | 5 refills | Status: DC
Start: 2022-12-28 — End: 2023-02-20

## 2022-12-28 NOTE — Patient Instructions (Addendum)
  Refer to physical therapy for neck pain Start topiramate  at bedtime.  Contact us in 4 weeks with update and we can increase dose if needed. Stop butalbital-caffeine-acetaminophen.  May still take cyclobenzaprine Take rizatriptan  at earliest onset of headache.  May repeat dose once in 2 hours if needed.  Maximum 2 tablets in 24 hours.  Take ondansetron for nausea Limit use of pain relievers to no more than 2 days out of the week.  These medications include acetaminophen, NSAIDs (ibuprofen/Advil/Motrin, naproxen/Aleve, triptans (Imitrex/sumatriptan), Excedrin, and narcotics.  This will help reduce risk of rebound headaches. Routine exercise Stay adequately hydrated (aim for 64 oz water daily) Keep headache diary Maintain proper stress management Maintain proper sleep hygiene Do not skip meals Follow up in 6 months.

## 2023-01-01 ENCOUNTER — Encounter: Payer: Self-pay | Admitting: Advanced Practice Midwife

## 2023-01-01 ENCOUNTER — Ambulatory Visit (INDEPENDENT_AMBULATORY_CARE_PROVIDER_SITE_OTHER): Payer: Medicaid Other | Admitting: Advanced Practice Midwife

## 2023-01-01 VITALS — BP 117/69 | HR 81 | Ht 64.0 in | Wt 233.8 lb

## 2023-01-01 DIAGNOSIS — Z30430 Encounter for insertion of intrauterine contraceptive device: Secondary | ICD-10-CM

## 2023-01-01 DIAGNOSIS — Z538 Procedure and treatment not carried out for other reasons: Secondary | ICD-10-CM

## 2023-01-01 DIAGNOSIS — Z3202 Encounter for pregnancy test, result negative: Secondary | ICD-10-CM | POA: Diagnosis not present

## 2023-01-01 DIAGNOSIS — Z3009 Encounter for other general counseling and advice on contraception: Secondary | ICD-10-CM

## 2023-01-01 DIAGNOSIS — N939 Abnormal uterine and vaginal bleeding, unspecified: Secondary | ICD-10-CM | POA: Diagnosis not present

## 2023-01-01 DIAGNOSIS — N946 Dysmenorrhea, unspecified: Secondary | ICD-10-CM

## 2023-01-01 LAB — POCT URINE PREGNANCY: Preg Test, Ur: NEGATIVE

## 2023-01-01 MED ORDER — MISOPROSTOL 200 MCG PO TABS
ORAL_TABLET | ORAL | 0 refills | Status: DC
Start: 2023-01-01 — End: 2023-01-08

## 2023-01-01 MED ORDER — CYCLOBENZAPRINE HCL 10 MG PO TABS
ORAL_TABLET | ORAL | 0 refills | Status: DC
Start: 2023-01-01 — End: 2023-03-20

## 2023-01-01 MED ORDER — LEVONORGESTREL 20 MCG/DAY IU IUD
1.0000 | INTRAUTERINE_SYSTEM | Freq: Once | INTRAUTERINE | Status: AC
  Administered 2023-01-01: 1 via INTRAUTERINE

## 2023-01-01 NOTE — Progress Notes (Signed)
NGYN presents for dysmenorrhea and [facility] consult. Pt reports periods started age 22, periods have always been painful. Pt interested in Paragard IUD. Last unprotected sex yesterday. No other concerns.

## 2023-01-01 NOTE — Progress Notes (Signed)
   GYNECOLOGY PROGRESS NOTE  History:  22 y.o. G0P0000 presents to [facility] office today for problem gyn visit. She reports painful heavy periods since menarche at age 42.  She has a regular period, wears super and overnight pads and changes x 4-5 on her heavy days.  She is sexually active and desires to prevent pregnancy.  Her medical dx include migraines and she reports a visual aura before her headaches.  She has taken OCPs in middle school, but no hormonal contraception since that time.  She denies h/a, dizziness, shortness of breath, n/v, or fever/chills.    The following portions of the patient's history were reviewed and updated as appropriate: allergies, current medications, past family history, past medical history, past social history, past surgical history and problem list.   Health Maintenance Due  Topic Date Due   COVID-19 Vaccine (1) Never done   HPV VACCINES (1 - 2-dose series) Never done   HIV Screening  Never done   DTaP/Tdap/Td (1 - Tdap) Never done     Review of Systems:  Pertinent items are noted in HPI.   Objective:  Physical Exam Blood pressure 117/69, pulse 81, height 5\' 4"  (1.626 m), weight 233 lb 12.8 oz (106.1 kg), last menstrual period 12/11/2022. VS reviewed, nursing note reviewed,  Constitutional: well developed, well nourished, no distress HEENT: normocephalic CV: normal rate Pulm/chest wall: normal effort Breast Exam: deferred Abdomen: soft Neuro: alert and oriented x 3 Skin: warm, dry Psych: affect normal Pelvic exam: Cervix pink, visually closed, without lesion, scant white creamy discharge, vaginal walls and external genitalia normal Bimanual exam: Cervix 0/long/high, firm, anterior, neg CMT, uterus nontender, nonenlarged, adnexa without tenderness, enlargement, or mass  Assessment & Plan:  1. Encounter for counseling regarding contraception --Discussed pt contraceptive plans and reviewed contraceptive methods based on pt preferences and  effectiveness.   --Pt migraines with aura make estrogen contraindicated but other progesterone-only and nonhormonal contraceptives were discussed. --Pt prefers hormonal IUD for contraception and improved menses.  - POCT urine pregnancy - cyclobenzaprine (FLEXERIL) 10 MG tablet; Take medication 2 hours before your office procedure  Dispense: 1 tablet; Refill: 0 - misoprostol (CYTOTEC) 200 MCG tablet; Take 400 mcg (2 tablets) all at once 2 hours before your office procedure  Dispense: 2 tablet; Refill: 0  2. Abnormal uterine bleeding (AUB)   3. Dysmenorrhea in adolescent    4. Unsuccessful IUD insertion --Attempt to insert Mirena IUD unsuccessful. Pt tolerated SSE and insertion process well but unable to insert dilator or IUD inserter through internal os of cervix despite several attempts.  --Pt still desires IUD so plan to send  pre-medications (Cytotec and Flexeril) and reschedule for next week.    Return in about 1 week (around 01/08/2023) for IUD insertion visit.   Sharen Counter, CNM 2:30 PM

## 2023-01-03 ENCOUNTER — Ambulatory Visit: Payer: Medicaid Other | Admitting: Physical Therapy

## 2023-01-06 ENCOUNTER — Encounter: Payer: Self-pay | Admitting: Advanced Practice Midwife

## 2023-01-06 DIAGNOSIS — Z3009 Encounter for other general counseling and advice on contraception: Secondary | ICD-10-CM

## 2023-01-08 ENCOUNTER — Other Ambulatory Visit: Payer: Self-pay

## 2023-01-08 ENCOUNTER — Ambulatory Visit: Payer: Medicaid Other | Admitting: Advanced Practice Midwife

## 2023-01-08 MED ORDER — MISOPROSTOL 200 MCG PO TABS
ORAL_TABLET | ORAL | 0 refills | Status: DC
Start: 2023-01-08 — End: 2023-01-08

## 2023-01-08 MED ORDER — MISOPROSTOL 200 MCG PO TABS
ORAL_TABLET | ORAL | 0 refills | Status: DC
Start: 2023-01-08 — End: 2023-03-20

## 2023-01-08 NOTE — Progress Notes (Signed)
Pt states Cytotec was not received by pharmacy Rx resent.

## 2023-01-08 NOTE — Addendum Note (Signed)
Addended by: Jearld Adjutant on: 01/08/2023 09:56 AM   Modules accepted: Orders

## 2023-01-09 ENCOUNTER — Encounter: Payer: Self-pay | Admitting: Advanced Practice Midwife

## 2023-01-09 ENCOUNTER — Ambulatory Visit (INDEPENDENT_AMBULATORY_CARE_PROVIDER_SITE_OTHER): Payer: Medicaid Other | Admitting: Advanced Practice Midwife

## 2023-01-09 ENCOUNTER — Ambulatory Visit: Payer: Medicaid Other | Admitting: Neurology

## 2023-01-09 VITALS — BP 120/76 | HR 71 | Ht 64.0 in | Wt 229.2 lb

## 2023-01-09 DIAGNOSIS — Z30017 Encounter for initial prescription of implantable subdermal contraceptive: Secondary | ICD-10-CM | POA: Diagnosis not present

## 2023-01-09 DIAGNOSIS — Z3202 Encounter for pregnancy test, result negative: Secondary | ICD-10-CM

## 2023-01-09 DIAGNOSIS — Z30430 Encounter for insertion of intrauterine contraceptive device: Secondary | ICD-10-CM

## 2023-01-09 LAB — POCT URINE PREGNANCY: Preg Test, Ur: NEGATIVE

## 2023-01-09 MED ORDER — ETONOGESTREL 68 MG ~~LOC~~ IMPL
68.0000 mg | DRUG_IMPLANT | Freq: Once | SUBCUTANEOUS | Status: AC
  Administered 2023-01-09: 68 mg via SUBCUTANEOUS

## 2023-01-09 NOTE — Progress Notes (Signed)
Pt presents for IUD insertion. Pt took Cytotec at 11am. Last unprotected sex last week.

## 2023-01-09 NOTE — Addendum Note (Signed)
Addended by: Jearld Adjutant on: 01/09/2023 04:47 PM   Modules accepted: Orders

## 2023-01-09 NOTE — Progress Notes (Signed)
   GYNECOLOGY OFFICE PROCEDURE NOTE  Alexandra [name] is a 22 y.o. G0P0000 here for Mirena IUD insertion. No GYN concerns.    IUD Insertion Procedure Note Patient identified, informed consent performed, consent signed.   Discussed risks of irregular bleeding, cramping, infection, malpositioning or misplacement of the IUD outside the uterus which may require further procedure such as laparoscopy. Time out was performed.  Urine pregnancy test negative.  Speculum placed in the vagina.  Cervix visualized.  Cleaned with Betadine x 2.  Grasped anteriorly with a single tooth tenaculum.  Unable to insert dilator through internal os.  Pt tolerated exam well and had Cytotec and Flexeril prior to exam. Pt started her menses the day of the exam. After 3 attempts, unable to insert dilator through os so IUD not attempted.    Discussed options with pt including rescheduling with additional premedications with MD or placing Nexplanon today.  Pt had bleeding x 1 month with Nexplanon before so discussed use of OCPs for breakthrough bleeding  until Nexplanon is in for a few months and see if bleeding improves. Pt agrees to have Nexplanon placed today.  FU in 3 months in the office.    Nexplanon Insertion Procedure Patient identified, informed consent performed, consent signed.   Patient does understand that irregular bleeding is a very common side effect of this medication. She was advised to have backup contraception for one week after placement. Pregnancy test in clinic today was negative.  Appropriate time out taken.  Patient's left arm was prepped and draped in the usual sterile fashion.. The ruler used to measure and mark insertion area.  Patient was prepped with alcohol swab and then injected with 3 ml of 1% lidocaine.  She was prepped with betadine, Nexplanon removed from packaging,  Device confirmed in needle, then inserted full length of needle and withdrawn per handbook instructions. Nexplanon was able  to palpated in the patient's arm; patient palpated the insert herself. There was minimal blood loss.  Patient insertion site covered with guaze and a pressure bandage to reduce any bruising.  The patient tolerated the procedure well and was given post procedure instructions.    1. Encounter for IUD insertion --IUD insertion unsuccessful.  Discussed options and pt elects to try Nexplanon again today with plan for OCPs if she has frequent bleeding.  2. Insertion of Nexplanon --See procedure note above.   Return in about 3 months (around 04/11/2023) for Follow up with female MD, MD only.   Sharen Counter, CNM 1:51 PM

## 2023-01-10 ENCOUNTER — Ambulatory Visit: Payer: Medicaid Other | Admitting: Physical Therapy

## 2023-01-17 ENCOUNTER — Other Ambulatory Visit: Payer: Self-pay | Admitting: Nurse Practitioner

## 2023-01-17 ENCOUNTER — Ambulatory Visit: Payer: Medicaid Other | Admitting: Physical Therapy

## 2023-01-17 DIAGNOSIS — Z9109 Other allergy status, other than to drugs and biological substances: Secondary | ICD-10-CM

## 2023-01-19 ENCOUNTER — Encounter: Payer: Self-pay | Admitting: Advanced Practice Midwife

## 2023-01-23 ENCOUNTER — Ambulatory Visit: Payer: Medicaid Other | Admitting: Physical Therapy

## 2023-01-28 ENCOUNTER — Encounter: Payer: Self-pay | Admitting: Orthopedic Surgery

## 2023-01-28 DIAGNOSIS — M545 Low back pain, unspecified: Secondary | ICD-10-CM

## 2023-02-09 ENCOUNTER — Encounter: Payer: Self-pay | Admitting: Orthopedic Surgery

## 2023-02-16 ENCOUNTER — Encounter: Payer: Self-pay | Admitting: Neurology

## 2023-02-20 ENCOUNTER — Other Ambulatory Visit: Payer: Self-pay

## 2023-02-20 MED ORDER — TOPIRAMATE 50 MG PO TABS: 50.0000 mg | ORAL_TABLET | Freq: Every day | ORAL | 5 refills | Status: DC

## 2023-02-20 NOTE — Progress Notes (Signed)
Per Dr.Jaffe,   Please increase topiramate to 50mg  at bedtime.  For rescue, I prescribed her rizatriptan.  Did she take that?  If she did and it didn't work, then she may come by the office next week to try samples of Nurtec.

## 2023-02-21 ENCOUNTER — Other Ambulatory Visit: Payer: Self-pay | Admitting: Neurology

## 2023-02-21 ENCOUNTER — Encounter: Payer: Self-pay | Admitting: Neurology

## 2023-02-21 MED ORDER — RIZATRIPTAN BENZOATE 10 MG PO TABS: 10.0000 mg | ORAL_TABLET | ORAL | 5 refills | Status: DC | PRN

## 2023-02-27 ENCOUNTER — Encounter: Payer: Self-pay | Admitting: Orthopedic Surgery

## 2023-02-28 ENCOUNTER — Encounter: Payer: Self-pay | Admitting: Radiology

## 2023-03-03 ENCOUNTER — Encounter: Payer: Self-pay | Admitting: Advanced Practice Midwife

## 2023-03-11 ENCOUNTER — Encounter: Payer: Self-pay | Admitting: Neurology

## 2023-03-13 ENCOUNTER — Other Ambulatory Visit: Payer: Self-pay

## 2023-03-13 MED ORDER — TOPIRAMATE 100 MG PO TABS: 100.0000 mg | ORAL_TABLET | Freq: Every day | ORAL | 0 refills | Status: DC

## 2023-03-13 NOTE — Progress Notes (Signed)
Per Dr.Jaffe,  1. I would increase topiramate to 100mg  at bedtime (please send new prescription). 2. For rescue, did she try the samples of Nurtec?  If they were effective, we can send in a prescription.  If they weren't effective, she may come by office to pick up samples of Ubrelvy to try (1 as needed, may repeat after 2 hours.  Maximum 2 tablets in 24 hours)    Topiramate 100 mg sent to the [facility].

## 2023-03-16 ENCOUNTER — Ambulatory Visit: Payer: Self-pay | Admitting: Nurse Practitioner

## 2023-03-19 ENCOUNTER — Encounter: Payer: Self-pay | Admitting: Cardiology

## 2023-03-19 NOTE — Telephone Encounter (Signed)
Alexandra Constant, MD  Alexandra Socks, LPN; Alexandra Sprague, MD Reasonable recommendation.  Patients with unexplained syncope are advised to have a 6 month restriction for private driving unless a clear non-cardiac sequale can be noted.  She saw Dr. Demetrius Charity in the past for autonomic dysfunction.  There is a specialist for this condition at [facility] [address] [facility]. If she would like, we would be willing to refer her.  [email or web address]

## 2023-03-20 ENCOUNTER — Encounter: Payer: Self-pay | Admitting: Allergy and Immunology

## 2023-03-20 ENCOUNTER — Ambulatory Visit: Payer: MEDICAID | Admitting: Allergy and Immunology

## 2023-03-20 ENCOUNTER — Other Ambulatory Visit: Payer: Self-pay

## 2023-03-20 VITALS — BP 102/60 | HR 64 | Temp 98.2°F | Resp 18 | Ht 63.0 in | Wt 235.0 lb

## 2023-03-20 DIAGNOSIS — J3089 Other allergic rhinitis: Secondary | ICD-10-CM

## 2023-03-20 MED ORDER — CETIRIZINE HCL 10 MG PO TABS: 10.0000 mg | ORAL_TABLET | Freq: Every day | ORAL | 3 refills | Status: AC

## 2023-03-20 MED ORDER — NURTEC 75 MG PO TBDP: 75.0000 mg | ORAL_TABLET | ORAL | 5 refills | Status: DC | PRN

## 2023-03-20 MED ORDER — OLOPATADINE HCL 0.2 % OP SOLN: 1.0000 [drp] | Freq: Every day | OPHTHALMIC | 3 refills | Status: AC | PRN

## 2023-03-20 MED ORDER — FLUTICASONE PROPIONATE 50 MCG/ACT NA SUSP: NASAL | 3 refills | Status: AC

## 2023-03-20 NOTE — Progress Notes (Signed)
Per Dr.Jaffe, Please send in a prescription for Nurtec once daily as needed (16 tablets).  She should take at earliest onset with the Zofran.  We just increased the topiramate to 100mg  and will need to give this dose at least 6 weeks.  Unfortunately there isn't any medication that will quickly decrease headache frequency.

## 2023-03-20 NOTE — Progress Notes (Unsigned)
[place] - [place] - [place]   Dear Angus Seller,  Thank you for referring Ilhan Debenedetto to the [facility] [place] on 03/20/2023.   Below is a summation of this patient's evaluation and recommendations.  Thank you for your referral. I will keep you informed about this patient's response to treatment.   If you have any questions please do not hesitate to contact me.   Sincerely,  Jessica Priest, MD Allergy / Immunology [facility]   ______________________________________________________________________    NEW PATIENT NOTE  Referring Provider: Ivonne Andrew, NP Primary Provider: Meriam Sprague, MD Date of office visit: 03/20/2023    Subjective:   Chief Complaint:  Alexandra [name] (DOB: 21-Mar-2001) is a 22 y.o. female who presents to the clinic on 03/20/2023 with a chief complaint of Allergic Reaction (Grass caused her to break out in hives and a rash. ) .     HPI: Ayleen presents to this clinic in evaluation of allergies.  She has a long history of developing problems with sneezing and nasal congestion and swollen eyes especially during the spring especially following exposure to grass.  As well, she will develop contact urticaria when being exposed to grass.  She has also had what sounds like a contact dermatitis when using an acne cream which resulted in her eye swelling.  She has tried Flonase and antihistamines during the spring which does appear to help somewhat.  Past Medical History:  Diagnosis Date   Anxiety    Dry skin    Eating disorder    Ehlers-Danlos disease    Fatty liver    POTS (postural orthostatic tachycardia syndrome)    Vision abnormalities     Past Surgical History:  Procedure Laterality Date   DENTAL SURGERY     TYMPANOSTOMY TUBE PLACEMENT      Allergies as of 03/20/2023       Reactions   Bee Pollen  Itching, Cough   "seasonal allergies"   Methylprednisolone Anxiety, Other (See Comments)   Became angry and had a "weird feeling"   Pollen Extract Hives, Itching, Cough   "seasonal allergies"        Medication List    amitriptyline 10 MG tablet Commonly known as: ELAVIL Take 1 tablet (10 mg total) by mouth at bedtime.   atomoxetine 18 MG capsule Commonly known as: STRATTERA Take 18 mg by mouth daily.   FLUoxetine 40 MG capsule Commonly known as: PROZAC Take 40 mg by mouth daily.   hydrOXYzine 25 MG tablet Commonly known as: ATARAX Take 25 mg by mouth daily at 6 (six) AM. Pt takes 1 table once a day.   hydrOXYzine 50 MG tablet Commonly known as: ATARAX Take 1 tablet (50 mg total) by mouth at bedtime.   linaclotide 72 MCG capsule Commonly known as: LINZESS Take 1 capsule (72 mcg total) by mouth daily before breakfast.   Nurtec 75 MG Tbdp Generic drug: Rimegepant Sulfate Take 1 tablet (75 mg total) by mouth as needed (take 1 tab at the earlist onset of a Migraine. Max 1 tab in 24 hours). Started by: CMA Sheena M   pantoprazole 40 MG tablet Commonly known as: PROTONIX Take 1 tablet by mouth 30 minutes before breakfast and evening meal   propranolol 40 MG tablet Commonly known as: INDERAL Take 1 tablet (40 mg total) by mouth 2 (two) times daily.   rizatriptan  10 MG tablet Commonly known as: Maxalt Take 1 tablet (10 mg total) by mouth as needed for migraine. May repeat in 2 hours if needed.  Maximum 2 tablets in 24 hours.   topiramate 100 MG tablet Commonly known as: TOPAMAX Take 1 tablet (100 mg total) by mouth at bedtime.        Review of systems negative except as noted in HPI / PMHx or noted below:  Review of Systems  Constitutional: Negative.   HENT: Negative.    Eyes: Negative.   Respiratory: Negative.    Cardiovascular: Negative.   Gastrointestinal: Negative.   Genitourinary: Negative.   Musculoskeletal: Negative.   Skin: Negative.    Neurological: Negative.   Endo/Heme/Allergies: Negative.   Psychiatric/Behavioral: Negative.      Family History  Problem Relation Age of Onset   Hyperthyroidism Mother    Diabetes Mother    Asthma Father    Allergic rhinitis Sister    Asthma Sister    Cataracts Sister    Strabismus Sister    Allergic rhinitis Brother    Non-Hodgkin's lymphoma Brother    Cancer Brother    Asthma Maternal Uncle    Colon cancer Neg Hx    Stomach cancer Neg Hx    Esophageal cancer Neg Hx    Colon polyps Neg Hx     Social History   Socioeconomic History   Marital status: Single    Spouse name: Not on file   Number of children: 0   Years of education: Not on file   Highest education level: Not on file  Occupational History   Occupation: Unemployed  Tobacco Use   Smoking status: Never    Passive exposure: Yes   Smokeless tobacco: Never   Tobacco comments:    family smokes outside  Vaping Use   Vaping status: Never Used  Substance and Sexual Activity   Alcohol use: No   Drug use: No   Sexual activity: Yes    Birth control/protection: None  Other Topics Concern   Not on file  Social History Narrative   Are you right handed or left handed?    Are you currently employed ?    What is your current occupation?   Do you live at home alone? NO   Who lives with you? Boyfriend and his family   What type of home do you live in: 1 story or 2 story?        Social Determinants of Health   Financial Resource Strain: Medium Risk (12/30/2021)   Overall Financial Resource Strain (CARDIA)    Difficulty of Paying Living Expenses: Somewhat hard  Food Insecurity: No Food Insecurity (12/30/2021)   Hunger Vital Sign    Worried About Running Out of Food in the Last Year: Never true    Ran Out of Food in the Last Year: Never true  Transportation Needs: Unmet Transportation Needs (03/06/2022)   PRAPARE - Administrator, Civil Service (Medical): Yes    Lack of Transportation (Non-Medical):  Yes  Physical Activity: Not on file  Stress: Not on file  Social Connections: Not on file  Intimate Partner Violence: Not on file    Environmental and Social history  Lives in a townhouse with a dry environment, no animals located inside the household, carpet in the bedroom, no plastic on the bed, no plastic on the pillow, and no smoking ongoing with inside the household.  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  Objective:   Vitals:   03/20/23 1426  BP: 102/60  Pulse: 64  Resp: 18  Temp: 98.2 F (36.8 C)  SpO2: 98%   Height: 5\' 3"  (160 cm) Weight: 235 lb (106.6 kg)  Physical Exam Constitutional:      Appearance: She is not diaphoretic.  HENT:     Head: Normocephalic.     Right Ear: Tympanic membrane, ear canal and external ear normal.     Left Ear: Tympanic membrane, ear canal and external ear normal.     Nose: Nose normal. No mucosal edema or rhinorrhea.     Mouth/Throat:     Pharynx: Uvula midline. No oropharyngeal exudate.  Eyes:     Conjunctiva/sclera: Conjunctivae normal.  Neck:     Thyroid: No thyromegaly.     Trachea: Trachea normal. No tracheal tenderness or tracheal deviation.  Cardiovascular:     Rate and Rhythm: Normal rate and regular rhythm.     Heart sounds: Normal heart sounds, S1 normal and S2 normal. No murmur heard. Pulmonary:     Effort: No respiratory distress.     Breath sounds: Normal breath sounds. No stridor. No wheezing or rales.  Lymphadenopathy:     Head:     Right side of head: No tonsillar adenopathy.     Left side of head: No tonsillar adenopathy.     Cervical: No cervical adenopathy.  Skin:    Findings: No erythema or rash.     Nails: There is no clubbing.  Neurological:     Mental Status: She is alert.     Diagnostics: Allergy skin tests were performed.  She did not demonstrate any hypersensitivity against a screening panel of aeroallergens.  Assessment and Plan:    1. Perennial allergic rhinitis     1. Allergen avoidance measures???  2. Treat and prevent inflammation:   A. Fluticasone - 2 sprays each nostril 3-7 times per week  3. If needed:   A. Cetirizine 10 mg - 1 tablet 1 time per day  B. Pataday - 1 drop each eye 1 time per day  4. Return to clinic in 12 weeks or earlier if problem  5. Immunotherapy??? Further evaluation???  6. Plan for fall flu vaccine  Surprisingly, Vern did not demonstrate any specific aeroallergen hypersensitivity today and we cannot really provide her with any allergen avoidance measures.  I am going to have her use some dose of nasal fluticasone on a relatively consistent basis and we will see what happens over the course of the next several months.  If she fails this therapy then we will need to further investigate for atopic disease with a check of antigens specific IgE antibodies.  Jessica Priest, MD Allergy / Immunology [facility] [place]

## 2023-03-20 NOTE — Patient Instructions (Addendum)
  1. Allergen avoidance measures???  2. Treat and prevent inflammation:   A. Fluticasone - 2 sprays each nostril 3-7 times per week  3. If needed:   A. Cetirizine 10 mg - 1 tablet 1 time per day  B. Pataday - 1 drop each eye 1 time per day  4. Return to clinic in 12 weeks or earlier if problem  5. Immunotherapy??? Further evaluation???  6. Plan for fall flu vaccine

## 2023-03-21 ENCOUNTER — Encounter: Payer: Self-pay | Admitting: Allergy and Immunology

## 2023-03-29 ENCOUNTER — Encounter: Payer: Self-pay | Admitting: *Deleted

## 2023-03-29 NOTE — Telephone Encounter (Signed)
To Whom It May Concern,   Please allow [name] to sit down during her work day as needed to help prevent presyncopal/syncopal symptoms. If you have any questions or concerns, please contact our office at [phone].   Sincerely,   Laurance Flatten    Letter written for the pt with the supporting documentation from Dr. Shari Prows, and/as indicated above.  Sent to this to the pts mychart account to have.  Pt aware via mychart message about this.

## 2023-04-06 ENCOUNTER — Ambulatory Visit: Payer: Medicaid Other | Admitting: Neurology

## 2023-04-14 ENCOUNTER — Encounter: Payer: Self-pay | Admitting: Cardiology

## 2023-04-19 ENCOUNTER — Other Ambulatory Visit: Payer: Self-pay | Admitting: Gastroenterology

## 2023-04-22 NOTE — Progress Notes (Unsigned)
Office Visit    Patient Name: Alexandra [name] Date of Encounter: 04/22/2023  Primary Care Provider:  Meriam Sprague, MD Primary Cardiologist:  Meriam Sprague, MD Primary Electrophysiologist: None   Past Medical History    Past Medical History:  Diagnosis Date   Anxiety    Dry skin    Eating disorder    Ehlers-Danlos disease    Fatty liver    POTS (postural orthostatic tachycardia syndrome)    Vision abnormalities    Past Surgical History:  Procedure Laterality Date   DENTAL SURGERY     TYMPANOSTOMY TUBE PLACEMENT      Allergies  Allergies  Allergen Reactions   Bee Pollen Itching and Cough    "seasonal allergies"   Methylprednisolone Anxiety and Other (See Comments)    Became angry and had a "weird feeling"   Pollen Extract Hives, Itching and Cough    "seasonal allergies"     History of Present Illness    Alexandra [name]  is a 22 year old female with a PMH of POTS, anxiety, Ehlers-Danlos who presents today for complaint of dizziness and tachycardia.  Alexandra [name] was seen initially by Dr. Shari Prows in 08/2021 for complaint of POTS and hypotension.  She was started on metoprolol 12.5 mg twice daily for tachycardia that helped however she developed nausea and vomiting.  Open to trying a new beta-blocker and was started on propranolol for palpitations when seen in follow-up 05/17/2022.  Propranolol was increased to 20 mg twice daily.  Compression socks or salt tabs at that time.  She was last seen by Dr. Shari Prows on 06/09/2022 at which time she reported improvement to her symptoms and compliance with compression socks and propranolol.  He was last seen on 07/04/2022 for follow-up.  She was provided work form to have more frequent breaks and to use a stool for sitting.  Since last being seen in the office patient reports that she has been experiencing chest pain and experiencing the sensation of feeling like passing out.  She described the  chest pain as squeezing that causes her heartbeat to feel like it has decreased.  She denies any prodrome for the symptoms and reports that they have occurred over the past 2 weeks.  Her blood pressure today was 94/62 and orthostatics were complete and are negative.  She also reports shortness of breath with pain when lying flat and increased bloating and swelling with solid intake.  She reports low compliance with compression stockings and has decreased her salt intake due to the above-mentioned bloating.  She is compliant with her current medications and denies any adverse reactions.  She reports patient denies chest pain, palpitations, dyspnea, PND, orthopnea, nausea, vomiting, dizziness, syncope, edema, weight gain, or early satiety.  Home Medications    Current Outpatient Medications  Medication Sig Dispense Refill   amitriptyline (ELAVIL) 10 MG tablet Take 1 tablet (10 mg total) by mouth at bedtime. 30 tablet 6   atomoxetine (STRATTERA) 18 MG capsule Take 18 mg by mouth daily.     cetirizine (ZYRTEC) 10 MG tablet Take 1 tablet (10 mg total) by mouth daily. 30 tablet 3   FLUoxetine (PROZAC) 40 MG capsule Take 40 mg by mouth daily.     fluticasone (FLONASE) 50 MCG/ACT nasal spray 2 sprays per nostril 3-7 times per week. 16 g 3   hydrOXYzine (ATARAX) 25 MG tablet Take 25 mg by mouth daily at 6 (six) AM. Pt takes 1 table once a  day.     hydrOXYzine (ATARAX) 50 MG tablet Take 1 tablet (50 mg total) by mouth at bedtime. 30 tablet 0   LINZESS 72 MCG capsule TAKE 1 CAPSULE(72 MCG) BY MOUTH DAILY BEFORE BREAKFAST 30 capsule 2   Olopatadine HCl (PATADAY) 0.2 % SOLN Place 1 drop into both eyes daily as needed. 2.5 mL 3   pantoprazole (PROTONIX) 40 MG tablet Take 1 tablet by mouth 30 minutes before breakfast and evening meal 60 tablet 2   propranolol (INDERAL) 40 MG tablet Take 1 tablet (40 mg total) by mouth 2 (two) times daily. 180 tablet 3   Rimegepant Sulfate (NURTEC) 75 MG TBDP Take 1 tablet (75 mg  total) by mouth as needed (take 1 tab at the earlist onset of a Migraine. Max 1 tab in 24 hours). 16 tablet 5   rizatriptan (MAXALT) 10 MG tablet Take 1 tablet (10 mg total) by mouth as needed for migraine. May repeat in 2 hours if needed.  Maximum 2 tablets in 24 hours. 10 tablet 5   topiramate (TOPAMAX) 100 MG tablet Take 1 tablet (100 mg total) by mouth at bedtime. 30 tablet 0   No current facility-administered medications for this visit.     Review of Systems  Please see the history of present illness.    (+) Chest pain, shortness of breath (+) Bloating, presyncope  All other systems reviewed and are otherwise negative except as noted above.  Physical Exam    Wt Readings from Last 3 Encounters:  03/20/23 235 lb (106.6 kg) (>99%, Z= 2.37)*  01/09/23 229 lb 3.2 oz (104 kg) (99%, Z= 2.31)*  01/01/23 233 lb 12.8 oz (106.1 kg) (>99%, Z= 2.36)*   * Growth percentiles are based on CDC (Girls, 2-20 Years) data.   WU:[id number] were no vitals filed for this visit.,There is no height or weight on file to calculate BMI.  Constitutional:      Appearance: Healthy appearance. Not in distress.  Neck:     Vascular: JVD normal.  Pulmonary:     Effort: Pulmonary effort is normal.     Breath sounds: No wheezing. No rales. Diminished in the bases Cardiovascular:     Normal rate. Regular rhythm. Normal S1. Normal S2.      Murmurs: There is no murmur.  Edema:    Peripheral edema absent.  Abdominal:     Palpations: Abdomen is soft non tender. There is no hepatomegaly.  Skin:    General: Skin is warm and dry.  Neurological:     General: No focal deficit present.     Mental Status: Alert and oriented to person, place and time.     Cranial Nerves: Cranial nerves are intact.  EKG/LABS/ Recent Cardiac Studies    ECG personally reviewed by me today -sinus rhythm with rate of 63 bpm and no acute changes consistent with previous EKG.   Lab Results  Component Value Date   WBC 8.0 12/07/2022   HGB  13.4 12/07/2022   HCT 39.6 12/07/2022   MCV 80.2 12/07/2022   PLT 238 12/07/2022   Lab Results  Component Value Date   CREATININE 0.73 12/07/2022   BUN 10 12/07/2022   NA 135 12/07/2022   K 3.5 12/07/2022   CL 103 12/07/2022   CO2 26 12/07/2022   Lab Results  Component Value Date   ALT 39 (H) 10/25/2022   AST 22 10/25/2022   ALKPHOS 88 10/25/2022   BILITOT 0.4 10/25/2022   Lab Results  Component  Value Date   CHOL 200 (H) 10/25/2022   HDL 38 (L) 10/25/2022   LDLCALC 134 (H) 10/25/2022   TRIG 158 (H) 10/25/2022   CHOLHDL 5.3 (H) 10/25/2022    Lab Results  Component Value Date   HGBA1C 5.4 10/25/2022     Assessment & Plan    1.  POTS: -Previously intolerant to metoprolol and currently tolerating propranolol twice daily -Orthostatic vitals were obtained today that were negative. -Patient feels as if her medications are currently working and suppressing her tachycardia.  2.  Shortness of breath/chest pain: -Patient reports chest pain that has occurred over the past [redacted] weeks along with shortness of breath. -Patient will undergo ETT for further evaluation and to rule out ischemia related to chest pain -We will complete echocardiogram  3.  Ehlers-Danlos syndrome: -Patient not a candidate for genetic testing -We will complete 2D echo for evaluation of chest pain  4.  Presyncope: -Patient reports multiple episodes of presyncope with no prodrome. -Patient will wear a 14-day ZIO monitor to evaluate for arrhythmia.  Disposition: Follow-up with Meriam Sprague, MD or APP in 2 months    Medication Adjustments/Labs and Tests Ordered: Current medicines are reviewed at length with the patient today.  Concerns regarding medicines are outlined above.   Signed, Napoleon Form, Leodis Rains, NP 04/22/2023, 4:57 PM [facility]

## 2023-04-23 ENCOUNTER — Encounter: Payer: Self-pay | Admitting: Nurse Practitioner

## 2023-04-23 ENCOUNTER — Ambulatory Visit: Payer: MEDICAID | Attending: Nurse Practitioner

## 2023-04-23 ENCOUNTER — Ambulatory Visit: Payer: MEDICAID | Attending: Nurse Practitioner | Admitting: Nurse Practitioner

## 2023-04-23 VITALS — BP 94/62 | HR 63 | Ht 63.0 in | Wt 241.2 lb

## 2023-04-23 DIAGNOSIS — R002 Palpitations: Secondary | ICD-10-CM | POA: Diagnosis not present

## 2023-04-23 DIAGNOSIS — R079 Chest pain, unspecified: Secondary | ICD-10-CM

## 2023-04-23 DIAGNOSIS — R0602 Shortness of breath: Secondary | ICD-10-CM

## 2023-04-23 DIAGNOSIS — G90A Postural orthostatic tachycardia syndrome (POTS): Secondary | ICD-10-CM

## 2023-04-23 DIAGNOSIS — F411 Generalized anxiety disorder: Secondary | ICD-10-CM | POA: Diagnosis not present

## 2023-04-23 DIAGNOSIS — Q796 Ehlers-Danlos syndrome, unspecified: Secondary | ICD-10-CM | POA: Diagnosis not present

## 2023-04-23 NOTE — Patient Instructions (Signed)
Medication Instructions:  Your physician recommends that you continue on your current medications as directed. Please refer to the Current Medication list given to you today.   *If you need a refill on your cardiac medications before your next appointment, please call your pharmacy*   Lab Work: None ordered  If you have labs (blood work) drawn today and your tests are completely normal, you will receive your results only by: MyChart Message (if you have MyChart) OR A paper copy in the mail If you have any lab test that is abnormal or we need to change your treatment, we will call you to review the results.   Testing/Procedures: Your physician has requested that you have an echocardiogram. Echocardiography is a painless test that uses sound waves to create images of your heart. It provides your doctor with information about the size and shape of your heart and how well your heart's chambers and valves are working. This procedure takes approximately one hour. There are no restrictions for this procedure. Please do NOT wear cologne, perfume, aftershave, or lotions (deodorant is allowed). Please arrive 15 minutes prior to your appointment time.  Your physician has requested that you have an exercise tolerance test. For further information please visit [email or web address] Please also follow instruction sheet, BELOW:  - DO NOT EAT, DRINK, OR USE TOBACCO PRODUCTS WITHIN 4 HOURS OF THE TEST - DRESS PREPARED TO EXERCISE IN A COMFORTABLE, 2 PIECE CLOTHING OUTFIT AND WALKING SHOES - BRING ANY PRESCRIPTION MEDICATIONS WITH YOU  - NOTIFY THE OFFICE 24 HOURS IN ADVANCE IF YOU NEED TO CANCEL - CALL THE OFFICE [phone] IF YOU HAVE ANY QUESTIONS   ZIO XT- Long Term Monitor Instructions  Your physician has requested you wear a ZIO patch monitor for 14 days.  This is a single patch monitor. [employer] supplies one patch monitor per enrollment. Additional stickers are not available. Please do not  apply patch if you will be having a Nuclear Stress Test,  Echocardiogram, Cardiac CT, MRI, or Chest Xray during the period you would be wearing the  monitor. The patch cannot be worn during these tests. You cannot remove and re-apply the  ZIO XT patch monitor.  Your ZIO patch monitor will be mailed 3 day USPS to your address on file. It may take 3-5 days  to receive your monitor after you have been enrolled.  Once you have received your monitor, please review the enclosed instructions. Your monitor  has already been registered assigning a specific monitor serial # to you.  Billing and Patient Assistance Program Information  We have supplied [employer] with any of your insurance information on file for billing purposes. [employer] offers a sliding scale Patient Assistance Program for patients that do not have  insurance, or whose insurance does not completely cover the cost of the ZIO monitor.  You must apply for the Patient Assistance Program to qualify for this discounted rate.  To apply, please call [employer] at [phone], select option 4, select option 2, ask to apply for  Patient Assistance Program. Meredeth Ide will ask your household income, and how many people  are in your household. They will quote your out-of-pocket cost based on that information.  [employer] will also be able to set up a 31-month, interest-free payment plan if needed.  Applying the monitor   Shave hair from upper left chest.  Hold abrader disc by orange tab. Rub abrader in 40 strokes over the upper left chest as  indicated in your monitor instructions.  Clean  area with 4 enclosed alcohol pads. Let dry.  Apply patch as indicated in monitor instructions. Patch will be placed under collarbone on left  side of chest with arrow pointing upward.  Rub patch adhesive wings for 2 minutes. Remove white label marked "1". Remove the white  label marked "2". Rub patch adhesive wings for 2 additional minutes.  While looking in a mirror,  press and release button in center of patch. A small green light will  flash 3-4 times. This will be your only indicator that the monitor has been turned on.  Do not shower for the first 24 hours. You may shower after the first 24 hours.  Press the button if you feel a symptom. You will hear a small click. Record Date, Time and  Symptom in the Patient Logbook.  When you are ready to remove the patch, follow instructions on the last 2 pages of Patient  Logbook. Stick patch monitor onto the last page of Patient Logbook.  Place Patient Logbook in the blue and white box. Use locking tab on box and tape box closed  securely. The blue and white box has prepaid postage on it. Please place it in the mailbox as  soon as possible. Your physician should have your test results approximately 7 days after the  monitor has been mailed back to [facility].  Call [facility] Customer Care at [phone] if you have questions regarding  your ZIO XT patch monitor. Call them immediately if you see an orange light blinking on your  monitor.  If your monitor falls off in less than 4 days, contact our Monitor department at [phone].  If your monitor becomes loose or falls off after 4 days call [employer] at [phone] for  suggestions on securing your monitor    Follow-Up: At [facility], you and your health needs are our priority.  As part of our continuing mission to provide you with exceptional heart care, we have created designated Provider Care Teams.  These Care Teams include your primary Cardiologist (physician) and Advanced Practice Providers (APPs -  Physician Assistants and Nurse Practitioners) who all work together to provide you with the care you need, when you need it.  We recommend signing up for the patient portal called "MyChart".  Sign up information is provided on this After Visit Summary.  MyChart is used to connect with patients for Virtual Visits (Telemedicine).  Patients  are able to view lab/test results, encounter notes, upcoming appointments, etc.  Non-urgent messages can be sent to your provider as well.   To learn more about what you can do with MyChart, go to [email or web address].    Your next appointment:   2 month(s)  Provider:   Robin Searing, NP         Other Instructions

## 2023-04-23 NOTE — Progress Notes (Unsigned)
 Enrolled [name] for a 14 day Zio XT monitor to be mailed to patients home  DOD to read/Alexandra [name]

## 2023-04-25 DIAGNOSIS — R079 Chest pain, unspecified: Secondary | ICD-10-CM

## 2023-04-25 DIAGNOSIS — F411 Generalized anxiety disorder: Secondary | ICD-10-CM

## 2023-04-25 DIAGNOSIS — Q796 Ehlers-Danlos syndrome, unspecified: Secondary | ICD-10-CM | POA: Diagnosis not present

## 2023-04-25 DIAGNOSIS — R002 Palpitations: Secondary | ICD-10-CM

## 2023-04-25 DIAGNOSIS — G90A Postural orthostatic tachycardia syndrome (POTS): Secondary | ICD-10-CM | POA: Diagnosis not present

## 2023-04-25 DIAGNOSIS — R0602 Shortness of breath: Secondary | ICD-10-CM

## 2023-04-26 ENCOUNTER — Ambulatory Visit: Payer: Self-pay | Admitting: Nurse Practitioner

## 2023-05-02 ENCOUNTER — Telehealth: Payer: Self-pay | Admitting: Nurse Practitioner

## 2023-05-02 NOTE — Telephone Encounter (Signed)
Instructed [name], if she is having a allergic reaction to her ZIO XT monitor, she should remove it and mail it to [facility] for processing.  She can apply Calamine lotion or Benadryl cream to the area for rash and itching.  The monitor will be billed based on how many days of data were recorded.

## 2023-05-02 NOTE — Telephone Encounter (Signed)
Pt states she has extreme irritation, itching, redness, and bumps where her heart monitor is. She has had it on for 7 days and advised to wear it for 14. Please advise.

## 2023-05-04 ENCOUNTER — Other Ambulatory Visit: Payer: Self-pay | Admitting: Nurse Practitioner

## 2023-05-04 MED ORDER — PRENATAL VITAMIN PLUS LOW IRON 27-1 MG PO TABS: 1.0000 | ORAL_TABLET | Freq: Every day | ORAL | 2 refills | Status: AC

## 2023-05-09 ENCOUNTER — Telehealth: Payer: Self-pay | Admitting: Cardiology

## 2023-05-09 NOTE — Telephone Encounter (Signed)
Patient have an issues with mailing the heart monitors in. Please advise

## 2023-05-09 NOTE — Telephone Encounter (Signed)
ZIO XT box with prepaid return USPS postage on it left at front desk for patient to pick up.

## 2023-05-09 NOTE — Telephone Encounter (Signed)
Patient notified directly and states her mom is on the way to pick up the package and she verified that her mom was listed as her emergency contact. (To make sure her mom could pick up the envelope)

## 2023-05-10 ENCOUNTER — Ambulatory Visit: Payer: Self-pay | Admitting: Nurse Practitioner

## 2023-05-16 ENCOUNTER — Ambulatory Visit (INDEPENDENT_AMBULATORY_CARE_PROVIDER_SITE_OTHER): Payer: MEDICAID

## 2023-05-16 ENCOUNTER — Ambulatory Visit (HOSPITAL_COMMUNITY): Payer: MEDICAID | Attending: Nurse Practitioner

## 2023-05-16 DIAGNOSIS — R0602 Shortness of breath: Secondary | ICD-10-CM

## 2023-05-16 DIAGNOSIS — F411 Generalized anxiety disorder: Secondary | ICD-10-CM

## 2023-05-16 DIAGNOSIS — G90A Postural orthostatic tachycardia syndrome (POTS): Secondary | ICD-10-CM

## 2023-05-16 DIAGNOSIS — Q796 Ehlers-Danlos syndrome, unspecified: Secondary | ICD-10-CM

## 2023-05-16 LAB — EXERCISE TOLERANCE TEST
Angina Index: 0
Duke Treadmill Score: 4
Estimated workload: 5.5
Exercise duration (min): 3 min
Exercise duration (sec): 47 s
MPHR: 201 {beats}/min
Peak HR: 169 {beats}/min
Percent HR: 84 %
RPE: 15
Rest HR: 69 {beats}/min
ST Depression (mm): 0 mm

## 2023-05-16 LAB — ECHOCARDIOGRAM COMPLETE
Area-P 1/2: 3.43 cm2
S' Lateral: 2.6 cm

## 2023-06-12 ENCOUNTER — Ambulatory Visit: Payer: MEDICAID | Admitting: Allergy and Immunology

## 2023-06-14 ENCOUNTER — Ambulatory Visit: Payer: MEDICAID | Admitting: Nurse Practitioner

## 2023-06-14 ENCOUNTER — Encounter: Payer: Self-pay | Admitting: Orthopedic Surgery

## 2023-06-15 MED ORDER — METHYLPREDNISOLONE 4 MG PO TBPK: ORAL_TABLET | ORAL | 0 refills | Status: DC

## 2023-06-15 NOTE — Addendum Note (Signed)
Addended by: Willia Craze on: 06/15/2023 12:21 PM   Modules accepted: Orders

## 2023-06-27 NOTE — Progress Notes (Deleted)
NEUROLOGY FOLLOW UP OFFICE NOTE  Alexandra [name] [id number]  Assessment/Plan:   Migraine without aura, with status migrainosus, not intractable, suspect cervicogenic component Cervicalgia   Migraine prevention:  restart topiramate at 25mg  but to take every night at bedtime.  We can increase to 50mg  in 4 weeks if needed Migraine rescue:  start rizatriptan 10mg  with Zofran.  Stop Fioricet May take cyclobenzaprine. Limit use of pain relievers to no more than 2 days out of week to prevent risk of rebound or medication-overuse headache. Keep headache diary Follow up 6 months.    Subjective:  Alexandra [name] is a 22 year old female with Ehlers-Danlos disease, POTS and anxiety who follows up for migraines.  UPDATE: Restarted topiramate and referred to PT for neck pain. Rizatriptan 10mg  zofran 4mg  caused burning in her head and ineffective.  Nurtec Stop fioricet *** Referred to PT  Intensity:  *** Duration:  *** Frequency:  *** Frequency of abortive medication: *** Current NSAIDS/analgesics:  Fioricet Current triptans:  none Current ergotamine:  none Current anti-emetic:  Zofran 4mg  Current muscle relaxants:  Flexeril 10mg  TID PRN Current Antihypertensive medications:  propranolol 40mg  BID Current Antidepressant medications:  amitriptyline 10mg  QHS (for abdominal spasms - higher doses caused dizziness), fluoxetine 40mg  daily Current Anticonvulsant medications:  topiramate 100mg  at bedtime Current anti-CGRP:  none Current Vitamins/Herbal/Supplements:  none Current Antihistamines/Decongestants:  none Other therapy:  PT neck *** Birth control:  none Other medications:  Strattera, hydroxyzine   Caffeine:  avoids due to palpitations Diet:  drinks a lot of water Depression:  stable; Anxiety:  stable Other pain:  EDS, neck pain Sleep hygiene:  usually okay  HISTORY: Onset:  Prior history of frequent headaches in 2022.  Subsequently resolved.  Restarted 4  to 5 weeks ago.  Gradually getting worse Location:  Left occipital and then radiates forward and across forehead.  Quality:  squeezing, pounding Intensity:  3-9/10.  Aura:  absent Prodrome:  absent Associated symptoms:  Left eye blurred vision, nausea, vomiting.  She denies associated photophobia, phonophobia, unilateral numbness or weakness. Duration:  several hours (severe 1-3 hours) Frequency:  daily Frequency of abortive medication: Fioricet and cyclobenzaprine daily for past week. 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  Otherwise, brain looks normal.  Followed by neurosurgery.  Finding unlikely to be cause of headaches.  Due to increased frequency and severity of headaches, she had a repeat MRI of brain with and without contrast on 12/20/2022 revealed stable 7 mm pineal cyst but otherwise unremarkable.    Had a routine eye exam  was fine  Past NSAIDS/analgesics:  ketorolac, ibuprofen, acetaminophen Past abortive triptans:  sumatriptan tab (caused head burning) Past abortive ergotamine:  none Past muscle relaxants:  tizanidine Past anti-emetic:  none Past antihypertensive medications:  metoprolol Past antidepressant medications:  venlafaxine, imipramine, desipramine Past anticonvulsant medications:  topiramate 25mg  PRN (for pain) Past anti-CGRP:  none Past vitamins/Herbal/Supplements:  magnesium oxide Past antihistamines/decongestants:  Zyrtec, Benadryl Other past therapies:  none   Family history of headache:  no  PAST MEDICAL HISTORY: Past Medical History:  Diagnosis Date   Anxiety    Dry skin    Eating disorder    Ehlers-Danlos disease    Fatty liver    POTS (postural orthostatic tachycardia syndrome)    Vision abnormalities     MEDICATIONS:  Current Outpatient Medications on File  Prior to Visit  Medication Sig Dispense Refill   Prenatal Vit-Fe Fumarate-FA (PRENATAL VITAMIN PLUS LOW IRON) 27-1 MG TABS Take 1 tablet by mouth daily. 30 tablet 2   amitriptyline (ELAVIL) 10 MG tablet Take 1 tablet (10 mg total) by mouth at bedtime. 30 tablet 6   atomoxetine (STRATTERA) 18 MG capsule Take 18 mg by mouth daily.     cetirizine (ZYRTEC) 10 MG tablet Take 1 tablet (10 mg total) by mouth daily. 30 tablet 3   FLUoxetine (PROZAC) 40 MG capsule Take 40 mg by mouth daily.     fluticasone (FLONASE) 50 MCG/ACT nasal spray 2 sprays per nostril 3-7 times per week. 16 g 3   hydrOXYzine (ATARAX) 25 MG tablet Take 25 mg by mouth daily at 6 (six) AM. Pt takes 1 table once a day.     hydrOXYzine (ATARAX) 50 MG tablet Take 1 tablet (50 mg total) by mouth at bedtime. 30 tablet 0   LINZESS 72 MCG capsule TAKE 1 CAPSULE(72 MCG) BY MOUTH DAILY BEFORE BREAKFAST 30 capsule 2   methylPREDNISolone (MEDROL DOSEPAK) 4 MG TBPK tablet Take as prescribed on the box 21 tablet 0   Olopatadine HCl (PATADAY) 0.2 % SOLN Place 1 drop into both eyes daily as needed. 2.5 mL 3   pantoprazole (PROTONIX) 40 MG tablet Take 1 tablet by mouth 30 minutes before breakfast and evening meal 60 tablet 2   propranolol (INDERAL) 40 MG tablet Take 1 tablet (40 mg total) by mouth 2 (two) times daily. 180 tablet 3   Rimegepant Sulfate (NURTEC) 75 MG TBDP Take 1 tablet (75 mg total) by mouth as needed (take 1 tab at the earlist onset of a Migraine. Max 1 tab in 24 hours). (Patient not taking: Reported on 04/23/2023) 16 tablet 5   topiramate (TOPAMAX) 100 MG tablet Take 1 tablet (100 mg total) by mouth at bedtime. 30 tablet 0   No current facility-administered medications on file prior to visit.    ALLERGIES: Allergies  Allergen Reactions   Bee Pollen Itching and Cough    "seasonal allergies"   Methylprednisolone Anxiety and Other (See Comments)    Became angry and had a "weird feeling"   Pollen Extract Hives, Itching and Cough     "seasonal allergies"    FAMILY HISTORY: Family History  Problem Relation Age of Onset   Hyperthyroidism Mother    Diabetes Mother    Asthma Father    Allergic rhinitis Sister    Asthma Sister    Cataracts Sister    Strabismus Sister    Allergic rhinitis Brother    Non-Hodgkin's lymphoma Brother    Cancer Brother    Asthma Maternal Uncle    Colon cancer Neg Hx    Stomach cancer Neg Hx    Esophageal cancer Neg Hx    Colon polyps Neg Hx       Objective:  *** General: No acute distress.  Patient appears ***-groomed.   Head:  Normocephalic/atraumatic Eyes:  Fundi examined but not visualized Neck: supple, no paraspinal tenderness, full range of motion Heart:  Regular rate and rhythm Lungs:  Clear to auscultation bilaterally Back: No paraspinal tenderness Neurological Exam: alert and oriented.  Speech fluent and not dysarthric, language intact.  CN II-XII intact. Bulk and tone normal, muscle strength 5/5 throughout.  Sensation to light touch intact.  Deep tendon reflexes 2+ throughout, toes downgoing.  Finger to nose testing intact.  Gait normal, Romberg negative.  Shon Millet, DO  CC: ***

## 2023-06-29 ENCOUNTER — Ambulatory Visit: Payer: Medicaid Other | Admitting: Neurology

## 2023-07-24 ENCOUNTER — Other Ambulatory Visit: Payer: Self-pay | Admitting: Physician Assistant

## 2023-07-24 ENCOUNTER — Other Ambulatory Visit: Payer: Self-pay | Admitting: Neurology

## 2023-07-25 ENCOUNTER — Encounter: Payer: Self-pay | Admitting: Gastroenterology

## 2023-07-27 ENCOUNTER — Other Ambulatory Visit: Payer: Self-pay

## 2023-07-27 ENCOUNTER — Encounter: Payer: Self-pay | Admitting: Nurse Practitioner

## 2023-07-27 ENCOUNTER — Ambulatory Visit (INDEPENDENT_AMBULATORY_CARE_PROVIDER_SITE_OTHER): Payer: MEDICAID | Admitting: Nurse Practitioner

## 2023-07-27 VITALS — BP 105/57 | HR 72 | Temp 98.1°F | Wt 268.0 lb

## 2023-07-27 DIAGNOSIS — H9202 Otalgia, left ear: Secondary | ICD-10-CM | POA: Insufficient documentation

## 2023-07-27 MED ORDER — PANTOPRAZOLE SODIUM 20 MG PO TBEC: 20.0000 mg | DELAYED_RELEASE_TABLET | Freq: Every day | ORAL | 2 refills | Status: DC

## 2023-07-27 MED ORDER — CIPROFLOXACIN HCL 0.2 % OT SOLN: 0.2000 mL | Freq: Two times a day (BID) | OTIC | 0 refills | Status: AC

## 2023-07-27 NOTE — Assessment & Plan Note (Signed)
1. Ear pain, left Will treat with - Ciprofloxacin HCl 0.2 % otic solution; Place 0.2 mLs into the left ear 2 (two) times daily for 7 days.  Dispense: 14 each; Refill: 0  Will refer to ENT if improvement in her symptoms after full course of antibiotics

## 2023-07-27 NOTE — Telephone Encounter (Signed)
Please do take pantoprazole 20 mg (decreased dose) every day x 4 weeks, if better, continue until follow-up.  Please call and 2RF Also make follow-up appointment with JLL for Feb 2025 RG

## 2023-07-27 NOTE — Patient Instructions (Signed)
1. Ear pain, left  - Ciprofloxacin HCl 0.2 % otic solution; Place 0.2 mLs into the left ear 2 (two) times daily for 7 days.  Dispense: 14 each; Refill: 0     It is important that you exercise regularly at least 30 minutes 5 times a week as tolerated  Think about what you will eat, plan ahead. Choose " clean, green, fresh or frozen" over canned, processed or packaged foods which are more sugary, salty and fatty. 70 to 75% of food eaten should be vegetables and fruit. Three meals at set times with snacks allowed between meals, but they must be fruit or vegetables. Aim to eat over a 12 hour period , example 7 am to 7 pm, and STOP after  your last meal of the day. Drink water,generally about 64 ounces per day, no other drink is as healthy. Fruit juice is best enjoyed in a healthy way, by EATING the fruit.  Thanks for choosing [facility] we consider it a privelige to serve you.

## 2023-07-27 NOTE — Progress Notes (Addendum)
Acute Office Visit  Subjective:     Patient ID: Alexandra [name], female    DOB: [birth year], 22 y.o.   MRN: [id number]  Chief Complaint  Patient presents with   Ear Pain    HPI Ms Alexandra [name]  has a past medical history of Anxiety, Dry skin, Eating disorder, Ehlers-Danlos disease, Fatty liver, POTS (postural orthostatic tachycardia syndrome), and Vision abnormalities. Patient is in today  for left ear pain since the past 1 week, stated that she has had tubes placed in both ears in the past.  She denies fever, chills, ear drainage, cough wheezing shortness of breath  Review of Systems  Constitutional:  Negative for appetite change, chills, fatigue and fever.  HENT:  Positive for ear pain. Negative for congestion, drooling, ear discharge, postnasal drip, rhinorrhea and sneezing.   Respiratory:  Negative for cough, shortness of breath and wheezing.   Cardiovascular:  Negative for chest pain, palpitations and leg swelling.  Gastrointestinal:  Negative for abdominal pain, constipation, nausea and vomiting.  Genitourinary:  Negative for difficulty urinating, dysuria, flank pain and frequency.  Musculoskeletal:  Negative for arthralgias, back pain, joint swelling and myalgias.  Skin:  Negative for color change, pallor, rash and wound.  Neurological:  Negative for dizziness, facial asymmetry, weakness, numbness and headaches.  Psychiatric/Behavioral:  Negative for behavioral problems, confusion, self-injury and suicidal ideas.         Objective:    BP (!) 105/57   Pulse 72   Temp 98.1 F (36.7 C)   Wt 268 lb (121.6 kg)   SpO2 100%   BMI 47.47 kg/m    Physical Exam Vitals and nursing note reviewed.  Constitutional:      General: She is not in acute distress.    Appearance: Normal appearance. She is obese. She is not ill-appearing, toxic-appearing or diaphoretic.  HENT:     Right Ear: Tympanic membrane, ear canal and external ear normal. There is no impacted cerumen.      Left Ear: Tympanic membrane, ear canal and external ear normal. There is no impacted cerumen.     Ears:     Comments: No drainage or redness noted on examination of bilateral ears.    Mouth/Throat:     Mouth: Mucous membranes are moist.     Pharynx: Oropharynx is clear. No oropharyngeal exudate or posterior oropharyngeal erythema.  Eyes:     General: No scleral icterus.       Right eye: No discharge.        Left eye: No discharge.     Extraocular Movements: Extraocular movements intact.     Conjunctiva/sclera: Conjunctivae normal.  Cardiovascular:     Rate and Rhythm: Normal rate and regular rhythm.     Pulses: Normal pulses.     Heart sounds: Normal heart sounds. No murmur heard.    No friction rub. No gallop.  Pulmonary:     Effort: Pulmonary effort is normal. No respiratory distress.     Breath sounds: Normal breath sounds. No stridor. No wheezing, rhonchi or rales.  Chest:     Chest wall: No tenderness.  Abdominal:     General: There is no distension.     Palpations: Abdomen is soft.     Tenderness: There is no abdominal tenderness. There is no right CVA tenderness, left CVA tenderness or guarding.  Musculoskeletal:        General: No swelling, tenderness, deformity or signs of injury.     Right lower leg: No edema.  Left lower leg: No edema.  Skin:    General: Skin is warm and dry.     Capillary Refill: Capillary refill takes less than 2 seconds.     Coloration: Skin is not jaundiced or pale.     Findings: No bruising, erythema or lesion.  Neurological:     Mental Status: She is alert and oriented to person, place, and time.     Motor: No weakness.     Coordination: Coordination normal.     Gait: Gait normal.  Psychiatric:        Mood and Affect: Mood normal.        Behavior: Behavior normal.        Thought Content: Thought content normal.        Judgment: Judgment normal.     No results found for any visits on 07/27/23.      Assessment & Plan:   Problem  List Items Addressed This Visit       Other   Ear pain, left - Primary    1. Ear pain, left Will treat with - Ciprofloxacin HCl 0.2 % otic solution; Place 0.2 mLs into the left ear 2 (two) times daily for 7 days.  Dispense: 14 each; Refill: 0  Will refer to ENT if improvement in her symptoms after full course of antibiotics      Relevant Medications   Ciprofloxacin HCl 0.2 % otic solution    Meds ordered this encounter  Medications   Ciprofloxacin HCl 0.2 % otic solution    Sig: Place 0.2 mLs into the left ear 2 (two) times daily for 7 days.    Dispense:  14 each    Refill:  0    No follow-ups on file.  Donell Beers, FNP

## 2023-07-30 NOTE — Telephone Encounter (Signed)
Dr. Chales Abrahams Pt Please see thread below Please advise as DOD  in regard to prescription for Linzess

## 2023-07-31 ENCOUNTER — Other Ambulatory Visit: Payer: Self-pay

## 2023-07-31 DIAGNOSIS — K5900 Constipation, unspecified: Secondary | ICD-10-CM

## 2023-07-31 MED ORDER — LINACLOTIDE 72 MCG PO CAPS: 72.0000 ug | ORAL_CAPSULE | Freq: Every day | ORAL | 2 refills | Status: DC

## 2023-07-31 NOTE — Telephone Encounter (Signed)
Sent pt  a my chart message advising . Alexandra [name]

## 2023-08-10 ENCOUNTER — Ambulatory Visit (HOSPITAL_COMMUNITY)
Admission: RE | Admit: 2023-08-10 | Discharge: 2023-08-10 | Disposition: A | Discharge status: Home / Self Care | Payer: MEDICAID | Source: Ambulatory Visit | Attending: Nurse Practitioner | Admitting: Nurse Practitioner

## 2023-08-10 ENCOUNTER — Encounter: Payer: Self-pay | Admitting: Nurse Practitioner

## 2023-08-10 ENCOUNTER — Ambulatory Visit (INDEPENDENT_AMBULATORY_CARE_PROVIDER_SITE_OTHER): Payer: MEDICAID | Admitting: Nurse Practitioner

## 2023-08-10 VITALS — BP 111/62 | HR 67 | Wt 266.2 lb

## 2023-08-10 DIAGNOSIS — R0781 Pleurodynia: Secondary | ICD-10-CM

## 2023-08-10 NOTE — Progress Notes (Signed)
Subjective   Patient ID: Alexandra [name], female    DOB: 2000-12-15, 22 y.o.   MRN: [id number]  Chief Complaint  Patient presents with   Letter for School/Work   rib pain     Referring provider: Ivonne Andrew, NP  [name] is a 22 y.o. female with Past Medical History: No date: Anxiety No date: Dry skin No date: Eating disorder No date: Ehlers-Danlos disease No date: Fatty liver No date: POTS (postural orthostatic tachycardia syndrome) No date: Vision abnormalities   HPI  Patient presents today to get a letter for her school stating that she has Ehlers-Danlos syndrome and POTS.  We will write a note for her today.  She also complains today of intermittent rib pain over the past few weeks.  She states that it feels like one of her ribs popped out of place and back again.  We will order x-ray today. Denies f/c/s, n/v/d, hemoptysis, PND, leg swelling Denies chest pain or edema      Allergies  Allergen Reactions   Bee Pollen Itching and Cough    "seasonal allergies"   Methylprednisolone Anxiety and Other (See Comments)    Became angry and had a "weird feeling"   Pollen Extract Hives, Itching and Cough    "seasonal allergies"    Immunization History  Administered Date(s) Administered   Influenza,inj,Quad PF,6+ Mos 07/21/2019, 05/31/2021    Tobacco History: Social History   Tobacco Use  Smoking Status Never   Passive exposure: Yes  Smokeless Tobacco Never  Tobacco Comments   family smokes outside   Counseling given: Not Answered Tobacco comments: family smokes outside   Outpatient Encounter Medications as of 08/10/2023  Medication Sig   amitriptyline (ELAVIL) 10 MG tablet Take 1 tablet (10 mg total) by mouth at bedtime.   atomoxetine (STRATTERA) 18 MG capsule Take 18 mg by mouth daily.   cetirizine (ZYRTEC) 10 MG tablet Take 1 tablet (10 mg total) by mouth daily.   FLUoxetine (PROZAC) 40 MG capsule Take 40 mg by mouth daily.    fluticasone (FLONASE) 50 MCG/ACT nasal spray 2 sprays per nostril 3-7 times per week.   hydrOXYzine (ATARAX) 25 MG tablet Take 25 mg by mouth daily at 6 (six) AM. Pt takes 1 table once a day.   hydrOXYzine (ATARAX) 50 MG tablet Take 1 tablet (50 mg total) by mouth at bedtime.   linaclotide (LINZESS) 72 MCG capsule Take 1 capsule (72 mcg total) by mouth daily before breakfast.   methylPREDNISolone (MEDROL DOSEPAK) 4 MG TBPK tablet Take as prescribed on the box   Olopatadine HCl (PATADAY) 0.2 % SOLN Place 1 drop into both eyes daily as needed.   pantoprazole (PROTONIX) 20 MG tablet Take 1 tablet (20 mg total) by mouth daily.   pantoprazole (PROTONIX) 40 MG tablet Take 1 tablet by mouth 30 minutes before breakfast and evening meal   Prenatal Vit-Fe Fumarate-FA (PRENATAL VITAMIN PLUS LOW IRON) 27-1 MG TABS Take 1 tablet by mouth daily.   propranolol (INDERAL) 40 MG tablet TAKE 1 TABLET(40 MG) BY MOUTH TWICE DAILY   Rimegepant Sulfate (NURTEC) 75 MG TBDP Take 1 tablet (75 mg total) by mouth as needed (take 1 tab at the earlist onset of a Migraine. Max 1 tab in 24 hours).   topiramate (TOPAMAX) 100 MG tablet Take 1 tablet (100 mg total) by mouth at bedtime.   No facility-administered encounter medications on file as of 08/10/2023.    Review of Systems  Review of  Systems  Constitutional: Negative.   HENT: Negative.    Cardiovascular: Negative.   Gastrointestinal: Negative.   Allergic/Immunologic: Negative.   Neurological: Negative.   Psychiatric/Behavioral: Negative.       Objective:   BP 111/62 (BP Location: Left Arm, Patient Position: Sitting, Cuff Size: Large)   Pulse 67   Wt 266 lb 3.2 oz (120.7 kg)   SpO2 98%   BMI 47.16 kg/m   Wt Readings from Last 5 Encounters:  08/10/23 266 lb 3.2 oz (120.7 kg)  07/27/23 268 lb (121.6 kg)  04/23/23 241 lb 3.2 oz (109.4 kg) (>99%, Z= 2.44)*  03/20/23 235 lb (106.6 kg) (>99%, Z= 2.37)*  01/09/23 229 lb 3.2 oz (104 kg) (99%, Z= 2.31)*   *  Growth percentiles are based on CDC (Girls, 2-20 Years) data.     Physical Exam Vitals and nursing note reviewed.  Constitutional:      General: She is not in acute distress.    Appearance: She is well-developed.  Cardiovascular:     Rate and Rhythm: Normal rate and regular rhythm.  Pulmonary:     Effort: Pulmonary effort is normal.     Breath sounds: Normal breath sounds.  Neurological:     Mental Status: She is alert and oriented to person, place, and time.       Assessment & Plan:   Rib pain on left side -     DG Ribs Unilateral W/Chest Left     No follow-ups on file.   Ivonne Andrew, NP 08/10/2023

## 2023-08-10 NOTE — Patient Instructions (Signed)
1. Rib pain on left side  - DG Ribs Unilateral W/Chest Left

## 2023-08-23 ENCOUNTER — Telehealth: Payer: Self-pay | Admitting: Nurse Practitioner

## 2023-08-23 ENCOUNTER — Encounter: Payer: Self-pay | Admitting: Physician Assistant

## 2023-08-23 ENCOUNTER — Ambulatory Visit: Payer: MEDICAID | Admitting: Physician Assistant

## 2023-08-23 DIAGNOSIS — G90A Postural orthostatic tachycardia syndrome (POTS): Secondary | ICD-10-CM

## 2023-08-23 NOTE — Telephone Encounter (Signed)
Forwarding to [employer] for advisement...Marland KitchenMarland KitchenMarland Kitchen Do you want me to refer pt to [facility] to their dysautonomia clinic?  Or where to refer?

## 2023-08-23 NOTE — Telephone Encounter (Signed)
Pt informed that office will call to make an appt to establish with a new cardiologist and further determined if referral to specialist is needed. Pt agreeable to plan

## 2023-08-23 NOTE — Telephone Encounter (Signed)
Patient called and said that she wants to see a POTS specialist. Wants to know if a referral can be sent in for patient to see EP specialist for POTS

## 2023-09-03 ENCOUNTER — Encounter: Payer: Self-pay | Admitting: Gastroenterology

## 2023-09-08 ENCOUNTER — Other Ambulatory Visit: Payer: Self-pay | Admitting: Neurology

## 2023-09-11 ENCOUNTER — Encounter: Payer: Self-pay | Admitting: Neurology

## 2023-09-11 NOTE — Telephone Encounter (Signed)
 Spoke with pt by phone.  Pt stated that after she take the Linzess   that she has been experiencing abdominal pain, nausea/vomiting, dark stool, diarrhea. Last BM was yesterday.  Pt was scheduled for an office visit for tomorrow with Dr. Charlanne on 09/12/2023 at 2:10 PM. Pt made aware.  Pt verbalized understanding with all questions answered.

## 2023-09-12 ENCOUNTER — Ambulatory Visit (INDEPENDENT_AMBULATORY_CARE_PROVIDER_SITE_OTHER): Payer: MEDICAID | Admitting: Gastroenterology

## 2023-09-12 ENCOUNTER — Emergency Department (HOSPITAL_COMMUNITY): Payer: MEDICAID

## 2023-09-12 ENCOUNTER — Encounter: Payer: Self-pay | Admitting: Gastroenterology

## 2023-09-12 ENCOUNTER — Encounter (HOSPITAL_COMMUNITY): Payer: Self-pay | Admitting: Emergency Medicine

## 2023-09-12 ENCOUNTER — Emergency Department (HOSPITAL_COMMUNITY)
Admission: EM | Admit: 2023-09-12 | Discharge: 2023-09-13 | Disposition: A | Discharge status: Home / Self Care | Payer: MEDICAID

## 2023-09-12 VITALS — BP 118/76 | HR 83 | Ht 63.0 in | Wt 266.0 lb

## 2023-09-12 DIAGNOSIS — R1032 Left lower quadrant pain: Secondary | ICD-10-CM | POA: Diagnosis not present

## 2023-09-12 DIAGNOSIS — R519 Headache, unspecified: Secondary | ICD-10-CM | POA: Diagnosis not present

## 2023-09-12 DIAGNOSIS — Q796 Ehlers-Danlos syndrome, unspecified: Secondary | ICD-10-CM | POA: Diagnosis not present

## 2023-09-12 DIAGNOSIS — F446 Conversion disorder with sensory symptom or deficit: Secondary | ICD-10-CM | POA: Diagnosis not present

## 2023-09-12 DIAGNOSIS — K760 Fatty (change of) liver, not elsewhere classified: Secondary | ICD-10-CM

## 2023-09-12 DIAGNOSIS — R569 Unspecified convulsions: Secondary | ICD-10-CM | POA: Diagnosis present

## 2023-09-12 DIAGNOSIS — K581 Irritable bowel syndrome with constipation: Secondary | ICD-10-CM | POA: Diagnosis not present

## 2023-09-12 DIAGNOSIS — R1030 Lower abdominal pain, unspecified: Secondary | ICD-10-CM

## 2023-09-12 DIAGNOSIS — R2990 Unspecified symptoms and signs involving the nervous system: Secondary | ICD-10-CM

## 2023-09-12 LAB — COMPREHENSIVE METABOLIC PANEL
ALT: 22 U/L (ref 0–44)
AST: 17 U/L (ref 15–41)
Albumin: 3.5 g/dL (ref 3.5–5.0)
Alkaline Phosphatase: 81 U/L (ref 38–126)
Anion gap: 8 (ref 5–15)
BUN: 12 mg/dL (ref 6–20)
CO2: 22 mmol/L (ref 22–32)
Calcium: 8.9 mg/dL (ref 8.9–10.3)
Chloride: 108 mmol/L (ref 98–111)
Creatinine, Ser: 0.81 mg/dL (ref 0.44–1.00)
GFR, Estimated: >60 mL/min (ref >60)
Glucose, Bld: 93 mg/dL (ref 70–99)
Potassium: 3.5 mmol/L (ref 3.5–5.1)
Sodium: 138 mmol/L (ref 135–145)
Total Bilirubin: 0.7 mg/dL (ref 0.0–1.2)
Total Protein: 6.6 g/dL (ref 6.5–8.1)

## 2023-09-12 LAB — PROTIME-INR
INR: 1.1 (ref 0.8–1.2)
Prothrombin Time: 13.9 s (ref 11.4–15.2)

## 2023-09-12 LAB — DIFFERENTIAL
Abs Immature Granulocytes: 0.04 10*3/uL (ref 0.00–0.07)
Basophils Absolute: 0 10*3/uL (ref 0.0–0.1)
Basophils Relative: 0 %
Eosinophils Absolute: 0.1 10*3/uL (ref 0.0–0.5)
Eosinophils Relative: 2 %
Immature Granulocytes: 0 %
Lymphocytes Relative: 35 %
Lymphs Abs: 3.4 10*3/uL (ref 0.7–4.0)
Monocytes Absolute: 0.6 10*3/uL (ref 0.1–1.0)
Monocytes Relative: 7 %
Neutro Abs: 5.4 10*3/uL (ref 1.7–7.7)
Neutrophils Relative %: 56 %

## 2023-09-12 LAB — CBC
HCT: 42 % (ref 36.0–46.0)
HCT: 42.7 % (ref 36.0–46.0)
Hemoglobin: 14.4 g/dL (ref 12.0–15.0)
Hemoglobin: 14.6 g/dL (ref 12.0–15.0)
MCH: 27.4 pg (ref 26.0–34.0)
MCH: 27.4 pg (ref 26.0–34.0)
MCHC: 34.3 g/dL (ref 30.0–36.0)
MCHC: 34.2 g/dL (ref 30.0–36.0)
MCV: 80 fL (ref 80.0–100.0)
MCV: 80.3 fL (ref 80.0–100.0)
Platelets: 249 10*3/uL (ref 150–400)
Platelets: 263 10*3/uL (ref 150–400)
RBC: 5.25 MIL/uL — ABNORMAL HIGH (ref 3.87–5.11)
RBC: 5.32 MIL/uL — ABNORMAL HIGH (ref 3.87–5.11)
RDW: 12.9 % (ref 11.5–15.5)
RDW: 13 % (ref 11.5–15.5)
WBC: 7.5 10*3/uL (ref 4.0–10.5)
WBC: 9.6 10*3/uL (ref 4.0–10.5)
nRBC: 0 % (ref 0.0–0.2)
nRBC: 0 % (ref 0.0–0.2)

## 2023-09-12 LAB — I-STAT CHEM 8, ED
BUN: 11 mg/dL (ref 6–20)
Calcium, Ion: 1.13 mmol/L — ABNORMAL LOW (ref 1.15–1.40)
Chloride: 108 mmol/L (ref 98–111)
Creatinine, Ser: 0.8 mg/dL (ref 0.44–1.00)
Glucose, Bld: 82 mg/dL (ref 70–99)
HCT: 43 % (ref 36.0–46.0)
Hemoglobin: 14.6 g/dL (ref 12.0–15.0)
Potassium: 4.6 mmol/L (ref 3.5–5.1)
Sodium: 139 mmol/L (ref 135–145)
TCO2: 22 mmol/L (ref 22–32)

## 2023-09-12 LAB — ETHANOL: Alcohol, Ethyl (B): <10 mg/dL (ref <10)

## 2023-09-12 LAB — URINALYSIS, ROUTINE W REFLEX MICROSCOPIC
Bilirubin Urine: NEGATIVE
Glucose, UA: NEGATIVE mg/dL
Hgb urine dipstick: NEGATIVE
Ketones, ur: NEGATIVE mg/dL
Nitrite: POSITIVE — AB
Protein, ur: NEGATIVE mg/dL
Renal Epithelial: 1
Specific Gravity, Urine: 1.019 (ref 1.005–1.030)
pH: 6 (ref 5.0–8.0)

## 2023-09-12 LAB — HCG, SERUM, QUALITATIVE: Preg, Serum: NEGATIVE

## 2023-09-12 LAB — APTT: aPTT: 23 s — ABNORMAL LOW (ref 24–36)

## 2023-09-12 LAB — LIPASE, BLOOD: Lipase: 47 U/L (ref 11–51)

## 2023-09-12 MED ORDER — GADOBUTROL 1 MMOL/ML IV SOLN
10.0000 mL | Freq: Once | INTRAVENOUS | Status: AC | PRN
  Administered 2023-09-12: 10 mL via INTRAVENOUS

## 2023-09-12 MED ORDER — PANTOPRAZOLE SODIUM 20 MG PO TBEC: 20.0000 mg | DELAYED_RELEASE_TABLET | Freq: Every day | ORAL | 3 refills | Status: AC

## 2023-09-12 MED ORDER — DIPHENHYDRAMINE HCL 50 MG/ML IJ SOLN
25.0000 mg | Freq: Once | INTRAMUSCULAR | Status: AC
  Administered 2023-09-12: 25 mg via INTRAVENOUS
  Filled 2023-09-12: qty 1

## 2023-09-12 MED ORDER — AMITRIPTYLINE HCL 10 MG PO TABS: 10.0000 mg | ORAL_TABLET | Freq: Every day | ORAL | 6 refills | Status: DC

## 2023-09-12 MED ORDER — LORAZEPAM 2 MG/ML IJ SOLN: 2.0000 mg | Freq: Once | INTRAMUSCULAR | Status: DC | PRN

## 2023-09-12 MED ORDER — PROCHLORPERAZINE EDISYLATE 10 MG/2ML IJ SOLN
5.0000 mg | Freq: Once | INTRAMUSCULAR | Status: AC
  Administered 2023-09-12: 5 mg via INTRAVENOUS
  Filled 2023-09-12: qty 2

## 2023-09-12 MED ORDER — LORAZEPAM 2 MG/ML IJ SOLN
1.0000 mg | INTRAMUSCULAR | Status: DC | PRN
  Administered 2023-09-12: 1 mg via INTRAVENOUS
  Filled 2023-09-12: qty 1

## 2023-09-12 NOTE — ED Triage Notes (Signed)
 PT BIB EMS from home. A & O x 4. Pt complains of migraine x 2 days. Yesterday had n/v/d. Anxious with EMS and numbness in upper extremities. N/v/d subsided yesterday   CBG 122

## 2023-09-12 NOTE — Discharge Instructions (Signed)
 You came to the ED for concern for seizures and left leg and arm numbness and weakness.   MRI brain and Cervical spine with and without contrast were reassuring, as was EEG  Please return to the ED for new or worsening symptoms  Please keep track of any other spells you have, including shaking spells, loss of consciousness events, staring spells etc to review with your neurologist, we have placed a referral for you.  Spell log details  - Date and time of event: - Description of event:  - Prodome (any warning / premonition event is going to happen): - Post-spell symptoms: - Any potential triggers: If someone is able to safely video the events if witnessed by someone, videos are very helpful to review with your neurologist. Also helpful to see how much you are able to respond to someone touching you, speaking to you etc.   Standard seizure precautions apply to dissociative seizures as well: Per [place]  [place] statutes, patients with seizures are not allowed to drive until  they have been seizure-free for six months. Use caution when using heavy equipment or power tools. Avoid working on ladders or at heights. Take showers instead of baths. Ensure the water temperature is not too high on the home water heater. Do not go swimming alone. When caring for infants or small children, sit down when holding, feeding, or changing them to minimize risk of injury to the child in the event you have a seizure.  To reduce risk of seizures, maintain good sleep hygiene avoid alcohol and illicit drug use, take all anti-seizure medications as prescribed.

## 2023-09-12 NOTE — Progress Notes (Signed)
 EEG complete - results pending

## 2023-09-12 NOTE — Code Documentation (Addendum)
 Responded to Code Stroke called on pt already in the ED for HA and upper extremity numbness. Code Stroke called at 1950, LSN-yesterday, CBG-93, NIH- 5, CT head negative for acute changes. TNK not given-outside window. Plan MRI/EEG. Please complete VS/neuro checks q2h x 12, then q4h.

## 2023-09-12 NOTE — ED Provider Notes (Signed)
 [place] EMERGENCY DEPARTMENT AT [facility] Provider Note   CSN: [id number] Arrival date & time: 09/12/23  1815     History  Chief Complaint  Patient presents with   Emesis   Migraine    Chasey Dull is a 23 y.o. female.  23 year old female with past medical history of POTS and Ehlers-Danlos syndrome presenting to the emergency department today with some shaking.  The patient had an episode of shaking last night.  Her mother states that throughout the day today she has been quieter than normal.  She was complaining of some left lower extremity numbness at around 4 PM.  The patient is also had a dull headache throughout the day.  She does report a history of migraines and this does feel similar.  She has not had any nausea or vomiting.  Her mother brought her to the ER today for further evaluation regarding this due to the his symptoms.  She apparently had what appeared to be a seizure-like episode just prior to arrival.   Emesis Migraine       Home Medications Prior to Admission medications   Medication Sig Start Date End Date Taking? Authorizing Provider  amitriptyline  (ELAVIL ) 10 MG tablet Take 1 tablet (10 mg total) by mouth at bedtime. 10/26/22   Charlanne Groom, MD  amitriptyline  (ELAVIL ) 10 MG tablet Take 1 tablet (10 mg total) by mouth at bedtime. 09/12/23   Charlanne Groom, MD  atomoxetine  (STRATTERA ) 18 MG capsule Take 18 mg by mouth daily. 06/13/22   [provider]  cetirizine  (ZYRTEC ) 10 MG tablet Take 1 tablet (10 mg total) by mouth daily. 03/20/23   Kozlow, Camellia PARAS, MD  FLUoxetine  (PROZAC ) 40 MG capsule Take 40 mg by mouth daily. 06/13/22   [provider]  fluticasone  (FLONASE ) 50 MCG/ACT nasal spray 2 sprays per nostril 3-7 times per week. 03/20/23   Kozlow, Camellia PARAS, MD  hydrOXYzine  (ATARAX ) 25 MG tablet Take 25 mg by mouth daily at 6 (six) AM. Pt takes 1 table once a day.    [provider]  hydrOXYzine  (ATARAX ) 50 MG  tablet Take 1 tablet (50 mg total) by mouth at bedtime. 06/13/22   Joshua Bari HERO, NP  linaclotide  (LINZESS ) 72 MCG capsule Take 1 capsule (72 mcg total) by mouth daily before breakfast. 07/31/23   Avram Lupita BRAVO, MD  Olopatadine  HCl (PATADAY ) 0.2 % SOLN Place 1 drop into both eyes daily as needed. 03/20/23   Kozlow, Camellia PARAS, MD  pantoprazole  (PROTONIX ) 20 MG tablet Take 1 tablet (20 mg total) by mouth daily. 09/12/23   Charlanne Groom, MD  Prenatal Vit-Fe Fumarate-FA (PRENATAL VITAMIN PLUS LOW IRON ) 27-1 MG TABS Take 1 tablet by mouth daily. 05/04/23   Oley Bascom RAMAN, NP  propranolol  (INDERAL ) 40 MG tablet TAKE 1 TABLET(40 MG) BY MOUTH TWICE DAILY 07/25/23   Dick, Ernest H Jr., NP  topiramate  (TOPAMAX ) 100 MG tablet Take 1 tablet (100 mg total) by mouth at bedtime. 03/13/23   Skeet Juliene SAUNDERS, DO      Allergies    Bee pollen, Methylprednisolone , and Pollen extract    Review of Systems   Review of Systems  Gastrointestinal:  Positive for vomiting.  Neurological:  Positive for dizziness and numbness.  All other systems reviewed and are negative.   Physical Exam Updated Vital Signs BP (!) 91/50   Pulse 72   Temp 97.8 F (36.6 C) (Oral)   Resp 15   LMP  (LMP Unknown)  Comment: nexplanon   SpO2 100%  Physical Exam Vitals and nursing note reviewed.   Gen: NAD Eyes: PERRL, EOMI HEENT: no oropharyngeal swelling Neck: trachea midline Resp: clear to auscultation bilaterally Card: RRR, no murmurs, rubs, or gallops Abd: nontender, nondistended Extremities: no calf tenderness, no edema Vascular: 2+ radial pulses bilaterally, 2+ DP pulses bilaterally Neuro: Please see NIH stroke scale from neurology team, ports diminished sensation to light touch over the left lower extremity Skin: no rashes Psyc: acting appropriately   ED Results / Procedures / Treatments   Labs (all labs ordered are listed, but only abnormal results are displayed) Labs Reviewed  CBC - Abnormal; Notable for the following  components:      Result Value   RBC 5.25 (*)    All other components within normal limits  URINALYSIS, ROUTINE W REFLEX MICROSCOPIC - Abnormal; Notable for the following components:   APPearance HAZY (*)    Nitrite POSITIVE (*)    Leukocytes,Ua SMALL (*)    Bacteria, UA FEW (*)    All other components within normal limits  APTT - Abnormal; Notable for the following components:   aPTT 23 (*)    All other components within normal limits  CBC - Abnormal; Notable for the following components:   RBC 5.32 (*)    All other components within normal limits  I-STAT CHEM 8, ED - Abnormal; Notable for the following components:   Calcium , Ion 1.13 (*)    All other components within normal limits  LIPASE, BLOOD  COMPREHENSIVE METABOLIC PANEL  HCG, SERUM, QUALITATIVE  ETHANOL  PROTIME-INR  DIFFERENTIAL    EKG EKG Interpretation Date/Time:  Wednesday September 12 2023 20:06:31 EST Ventricular Rate:  105 PR Interval:  174 QRS Duration:  90 QT Interval:  345 QTC Calculation: 456 R Axis:   31  Text Interpretation: Sinus tachycardia Low voltage, precordial leads Confirmed by Jerral Meth [id number]) on 09/13/2023 11:08:31 AM  Radiology No results found.   Procedures Procedures    Medications Ordered in ED Medications  gadobutrol  (GADAVIST ) 1 MMOL/ML injection 10 mL (10 mLs Intravenous Contrast Given 09/12/23 2235)  prochlorperazine  (COMPAZINE ) injection 5 mg (5 mg Intravenous Given 09/12/23 2353)  diphenhydrAMINE  (BENADRYL ) injection 25 mg (25 mg Intravenous Given 09/12/23 2353)    ED Course/ Medical Decision Making/ A&P                                 Medical Decision Making 23 year old female with past medical history of Ehlers-Danlos and POTS presenting to the emergency department today with concern for left lower extremity numbness.  I saw the patient in conjunction with neurology who is bedside if the patient got into her room after the CT scan of her head.  Plan is for MRI of her  brain with and without contrast as well as an EEG here in the emergency department.  Patient does not appear to be have any active seizures here.  She will be reevaluated for ultimate disposition.  The plan will be for discharge if her workup here is reassuring and she remained stable here in the emergency department.  The patient CT scan is unremarkable.  Labs are reassuring.  MRIs are pending at the time of signout.  Plan is for discharge if these are unremarkable.  The patient's EEG did not show any seizure activity.  She is given Compazine  and Benadryl  for headache.  The patient's MRI scans are pending at  the time of signout.  If these are negative she will be discharged.    Amount and/or Complexity of Data Reviewed Labs: ordered. Radiology: ordered.  Risk Prescription drug management.           Final Clinical Impression(s) / ED Diagnoses Final diagnoses:  Nonintractable headache, unspecified chronicity pattern, unspecified headache type  Seizure-like activity (HCC)    Rx / DC Orders ED Discharge Orders          Ordered    Ambulatory referral to Neurology       Comments: An appointment is requested in approximately: 2 weeks   09/13/23 0159              Ula Prentice SAUNDERS, MD 09/15/23 1506

## 2023-09-12 NOTE — Patient Instructions (Addendum)
 _______________________________________________________  If your blood pressure at your visit was 140/90 or greater, please contact your primary care physician to follow up on this.  _______________________________________________________  If you are age 21 or older, your body mass index should be between 23-30. Your Body mass index is 47.12 kg/m. If this is out of the aforementioned range listed, please consider follow up with your Primary Care Provider.  If you are age 22 or younger, your body mass index should be between 19-25. Your Body mass index is 47.12 kg/m. If this is out of the aformentioned range listed, please consider follow up with your Primary Care Provider.   ________________________________________________________  The [facility] providers would like to encourage you to use MYCHART to communicate with providers for non-urgent requests or questions.  Due to long hold times on the telephone, sending your provider a message by [facility] may be a faster and more efficient way to get a response.  Please allow 48 business hours for a response.  Please remember that this is for non-urgent requests.  _______________________________________________________  Rosine have been scheduled for a CT Angio at [facility] on the first floor of the hospital on   09-20-23 at 3:30pm . Arrive 15 minutes prior Only water 4 hours prior to appointment. No food 4 hours prior. If you have any questions please call [phone].   We have sent the following medications to your pharmacy for you to pick up at your convenience: Protonix  Elavil   Please purchase the following medications over the counter and take as directed: Miralax  17g daily                                     Please follow up in 6 months. Give us  a call at [phone] to schedule an appointment.  Thank you,  Dr. Lynnie Bring

## 2023-09-12 NOTE — Procedures (Signed)
 Patient Name: Alexandra [name]  MRN: [id number]  Epilepsy Attending: Arlin MALVA Krebs  Referring Physician/Provider: Jerrie Lola CROME, MD  Date: 09/12/2023 Duration: 22.54 mins  Patient history: 22yo F with seizure like activity getting eeg to evaluate for seizure  Level of alertness: Awake  AEDs during EEG study: Ativan   Technical aspects: This EEG study was done with scalp electrodes positioned according to the 10-20 International system of electrode placement. Electrical activity was reviewed with band pass filter of 1-70Hz , sensitivity of 7 uV/mm, display speed of 18mm/sec with a 60Hz  notched filter applied as appropriate. EEG data were recorded continuously and digitally stored.  Video monitoring was available and reviewed as appropriate.  Description: The posterior dominant rhythm consists of 10 Hz activity of moderate voltage (25-35 uV) seen predominantly in posterior head regions, symmetric and reactive to eye opening and eye closing. Physiologic photic driving was seen during photic stimulation. Hyperventilation was not performed.     IMPRESSION: This study is within normal limits. No seizures or epileptiform discharges were seen throughout the recording.  A normal interictal EEG does not exclude the diagnosis of epilepsy.   Ezelle Surprenant O Vercie Pokorny

## 2023-09-12 NOTE — Consult Note (Addendum)
 NEUROLOGY CONSULT NOTE   Date of service: September 12, 2023 Patient Name: Alexandra [name] MRN:  [id number] DOB:  2001-06-18 Chief Complaint: seizure, left leg > arm numbness, brief episode of double vision, increased tics of the face from normal Requesting Provider: Ula Prentice SAUNDERS, MD  History of Present Illness  Alexandra [name] is a 23 y.o. female  has a past medical history of Anxiety, Dry skin, Eating disorder, Ehlers-Danlos disease, Fatty liver, POTS (postural orthostatic tachycardia syndrome), and Vision abnormalities.     She had a normal day yesterday.  However overnight, her sibling awoke to the patient having some shaking movements.  She tried to wake her up and it took a while to wake the patient up.  Subsequently the patient is aware of feeling pretty confused about why she was woken up and her eyes rolling back.  She subsequently went to her GI appointment today for concern for black stools which was attributed to Linzess .  She was quieter than normal during this appointment.  She has been having fluctuating left leg numbness in particular for some time today (unclear time of onset) but at times she has also reported this going into her left arm.  Mom became concerned about this and was on the phone with an advisor discussing whether or not to take the patient by private vehicle or EMS to the hospital for further evaluation.  Subsequently while sitting in the patient had another episode of 2 minutes of shaking, being blanked out.  While in the ED being evaluated she went to use the bathroom and while ambulating had double vision lasting about 2 minutes  She has had abnormal movements of her face before and these seem to be increased today  Mom is also concerned about the patient having dysautonomia  No loss of bowel or bladder control, no tongue bites.  Regarding seizure risk factors: Normal birth and development No history of significant head injury No history of  meningitis/encephalitis No personal history of seizures 2 half siblings with possible seizures (brother with some episodes that were evaluated by a primary care physician and resolved without treatment, sister with febrile seizures reportedly at age 49 and age 48, never treated with medications)   LKW: 1/7 Modified rankin score: 0-Completely asymptomatic and back to baseline post- stroke IV Thrombolysis: No, out of the window EVT: No, exam not c/w LVO   NIHSS components Score: Comment  1a Level of Conscious 0[x]  1[]  2[]  3[]      1b LOC Questions 0[]  1[x]  2[]     Reported that the month is February  1c LOC Commands 0[x]  1[]  2[]       2 Best Gaze 0[x]  1[]  2[]       3 Visual 0[x]  1[]  2[]  3[]      4 Facial Palsy 0[x]  1[]  2[]  3[]      5a Motor Arm - left 0[x]  1[]  2[]  3[]  4[]  UN[]    5b Motor Arm - Right 0[x]  1[]  2[]  3[]  4[]  UN[]    6a Motor Leg - Left 0[]  1[x]  2[]  3[]  4[]  UN[]    6b Motor Leg - Right 0[x]  1[]  2[]  3[]  4[]  UN[]    7 Limb Ataxia 0[x]  1[]  2[]  3[]  UN[]     8 Sensory 0[]  1[]  2[x]  UN[]    Reports severe loss of sensation in the left leg  9 Best Language 0[]  1[x]  2[]  3[]      10 Dysarthria 0[x]  1[]  2[]  UN[]      11 Extinct. and Inattention 0[x]  1[]  2[]   TOTAL:       ROS  Comprehensive ROS performed and pertinent positives documented in HPI    Past History   Past Medical History:  Diagnosis Date   Anxiety    Dry skin    Eating disorder    Ehlers-Danlos disease    Fatty liver    POTS (postural orthostatic tachycardia syndrome)    Vision abnormalities     Past Surgical History:  Procedure Laterality Date   DENTAL SURGERY     TYMPANOSTOMY TUBE PLACEMENT      Family History: Family History  Problem Relation Age of Onset   Hyperthyroidism Mother    Diabetes Mother    Asthma Father    Allergic rhinitis Sister    Asthma Sister    Cataracts Sister    Strabismus Sister    Allergic rhinitis Brother    Non-Hodgkin's lymphoma Brother    Cancer Brother    Asthma Maternal  Uncle    Colon cancer Neg Hx    Stomach cancer Neg Hx    Esophageal cancer Neg Hx    Colon polyps Neg Hx     Social History  reports that she has never smoked. She has been exposed to tobacco smoke. She has never used smokeless tobacco. She reports that she does not drink alcohol and does not use drugs.  Allergies  Allergen Reactions   Bee Pollen Itching and Cough    seasonal allergies   Methylprednisolone  Anxiety and Other (See Comments)    Became angry and had a weird feeling   Pollen Extract Hives, Itching and Cough    seasonal allergies    Medications   Current Facility-Administered Medications:    LORazepam  (ATIVAN ) injection 2 mg, 2 mg, Intravenous, Once PRN, Jaye Saal L, MD  Current Outpatient Medications:    amitriptyline  (ELAVIL ) 10 MG tablet, Take 1 tablet (10 mg total) by mouth at bedtime., Disp: 30 tablet, Rfl: 6   amitriptyline  (ELAVIL ) 10 MG tablet, Take 1 tablet (10 mg total) by mouth at bedtime., Disp: 30 tablet, Rfl: 6   atomoxetine  (STRATTERA ) 18 MG capsule, Take 18 mg by mouth daily., Disp: , Rfl:    cetirizine  (ZYRTEC ) 10 MG tablet, Take 1 tablet (10 mg total) by mouth daily., Disp: 30 tablet, Rfl: 3   FLUoxetine  (PROZAC ) 40 MG capsule, Take 40 mg by mouth daily., Disp: , Rfl:    fluticasone  (FLONASE ) 50 MCG/ACT nasal spray, 2 sprays per nostril 3-7 times per week., Disp: 16 g, Rfl: 3   hydrOXYzine  (ATARAX ) 25 MG tablet, Take 25 mg by mouth daily at 6 (six) AM. Pt takes 1 table once a day., Disp: , Rfl:    hydrOXYzine  (ATARAX ) 50 MG tablet, Take 1 tablet (50 mg total) by mouth at bedtime., Disp: 30 tablet, Rfl: 0   linaclotide  (LINZESS ) 72 MCG capsule, Take 1 capsule (72 mcg total) by mouth daily before breakfast., Disp: 30 capsule, Rfl: 2   Olopatadine  HCl (PATADAY ) 0.2 % SOLN, Place 1 drop into both eyes daily as needed., Disp: 2.5 mL, Rfl: 3   pantoprazole  (PROTONIX ) 20 MG tablet, Take 1 tablet (20 mg total) by mouth daily., Disp: 90 tablet, Rfl:  3   Prenatal Vit-Fe Fumarate-FA (PRENATAL VITAMIN PLUS LOW IRON ) 27-1 MG TABS, Take 1 tablet by mouth daily., Disp: 30 tablet, Rfl: 2   propranolol  (INDERAL ) 40 MG tablet, TAKE 1 TABLET(40 MG) BY MOUTH TWICE DAILY, Disp: 180 tablet, Rfl: 2   topiramate  (TOPAMAX ) 100 MG tablet, Take 1 tablet (  100 mg total) by mouth at bedtime., Disp: 30 tablet, Rfl: 0  Vitals   Vitals:   2023/10/05 1820 10/05/23 2016  BP: 113/80 134/73  Pulse: 96 85  Resp: 18   Temp: 97.6 F (36.4 C)   TempSrc: Oral   SpO2: 98%     There is no height or weight on file to calculate BMI.  Physical Exam   Constitutional: Appears well-developed and well-nourished.  Psych: Affect appropriate to situation, anxious, cooperative, pleasant Eyes: No scleral injection.  HENT: No OP obstruction.  Head: Normocephalic.  Cardiovascular: Normal rate and regular rhythm.  Respiratory: Slightly tachypneic, no increased work of breathing GI: Soft.  No distension. There is no tenderness.  Skin: Warm dry and intact visible skin  Neurologic Examination   Physical Exam  Constitutional: Appears well-developed and well-nourished.  Psych: Affect appropriate to situation Eyes: No scleral injection HENT: No OP obstrucion MSK: no joint deformities.  Cardiovascular: Normal rate and regular rhythm.  Respiratory: Effort normal, non-labored breathing GI: Soft.  No distension. There is no tenderness.  Skin: WDI  Neuro: Mental Status: Patient is awake, alert, oriented to person, place, age but not month and able to give some history of situation. Mild difficulty with repetition and hesitant to name objects at times, unclear whether true aphasia versus functional speech pattern Cranial Nerves: II: Visual Fields are full. Pupils are equal, round, and reactive to light.   III,IV, VI: EOMI without ptosis or diploplia.  V: Facial sensation is symmetric to temperature VII: Facial movement is symmetric.  VIII: hearing is intact to voice X:  Uvula elevates symmetrically XI: Shoulder shrug is symmetric. XII: tongue is midline without atrophy or fasciculations.  Motor: Tone is normal. Bulk is normal.  No pronator drift.  Some giveaway weakness of the left side, intermittent Sensory: Reports severe loss of sensation in the left leg and some loss of sensation in the left arm.  However when not being formally tested, is responsive to stimuli in the left leg Deep Tendon Reflexes: 2+ and symmetric in the biceps and patellae, brisk Cerebellar: FNF and HKS are intact bilaterally Gait: Bears weight equally on both legs with standing.  Able to raise better on the toes of the left than the right initially, but able to even this out with encouragement.  Then when rocking back on the heels raises better on the right than the left.  Normal gait for ambulating to the stretcher, turns easily and lifts both legs into the stretcher with equal effort    Labs/Imaging/Neurodiagnostic studies   CBC:  Recent Labs  Lab 10-05-23 1825 October 05, 2023 2012 2023-10-05 2019  WBC 7.5 9.6  --   NEUTROABS  --  5.4  --   HGB 14.4 14.6 14.6  HCT 42.0 42.7 43.0  MCV 80.0 80.3  --   PLT 249 263  --    Basic Metabolic Panel:  Lab Results  Component Value Date   NA 139 10-05-2023   K 4.6 10-05-2023   CO2 22 2023/10/05   GLUCOSE 82 October 05, 2023   BUN 11 2023-10-05   CREATININE 0.80 10-05-2023   CALCIUM  8.9 05-Oct-2023   GFRNONAA >60 October 05, 2023   GFRAA NOT CALCULATED 03/21/2017   Lipid Panel:  Lab Results  Component Value Date   LDLCALC 134 (H) 10/25/2022   HgbA1c:  Lab Results  Component Value Date   HGBA1C 5.4 10/25/2022   Urine Drug Screen:     Component Value Date/Time   LABOPIA NONE DETECTED 04/14/2022 1840  COCAINSCRNUR NONE DETECTED 04/14/2022 1840   COCAINSCRNUR Negative 12/04/2017 1630   LABBENZ NONE DETECTED 04/14/2022 1840   AMPHETMU NONE DETECTED 04/14/2022 1840   THCU NONE DETECTED 04/14/2022 1840   LABBARB NONE DETECTED  04/14/2022 1840    Alcohol Level     Component Value Date/Time   ETH <5 12/04/2016 1208   INR No results found for: INR APTT No results found for: APTT AED levels: No results found for: PHENYTOIN, ZONISAMIDE, LAMOTRIGINE, LEVETIRACETA  CT Head without contrast(Personally reviewed): no acute intracranial process   MRI Brain and cervical spine with and without contrast (to be reviewed): pending  Neurodiagnostics rEEG:  Description: The posterior dominant rhythm consists of 10 Hz activity of moderate voltage (25-35 uV) seen predominantly in posterior head regions, symmetric and reactive to eye opening and eye closing. Physiologic photic driving was seen during photic stimulation. Hyperventilation was not performed.    IMPRESSION: This study is within normal limits. No seizures or epileptiform discharges were seen throughout the recording. A normal interictal EEG does not exclude the diagnosis of epilepsy.  ASSESSMENT   Sherica Anzaldo [name] is a 23 y.o. female  has a past medical history of Anxiety, Dry skin, Eating disorder, Ehlers-Danlos disease, Fatty liver, POTS (postural orthostatic tachycardia syndrome), and Vision abnormalities.   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  This was reviewed with patient and mom at bedside, [email or web address] website was reviewed with mom   Differential in a patient this age would include otherwise include potential seizures, multiple sclerosis, less likely stroke, and it is important to screen for these etiologies  To rule out significant organic pathology will obtain MRI brain and cervical spine with and without contrast with sedation.  Prior to sedation we will obtain EEG to screen for epileptogenic activity  RECOMMENDATIONS  -Routine EEG -MRI brain with and without contrast, MRI cervical spine with and without contrast; 2 mg Ativan  for sedation for scan -If the studies are normal, and patient  remains stable outpatient follow-up is appropriate -Discussed with Dr. Ula at bedside, appreciate medical clearance per ED team -If there are any significant abnormalities, this note will be addended with further recommendations  ______________________________________________________________________   Lola Jernigan MD-PhD [facility] [phone] Available 7 PM to 7 AM, outside of these hours please call Neurologist on call as listed on Amion.   CRITICAL CARE Performed by: Lola LITTIE Jernigan   Total critical care time: 50 minutes  Critical care time was exclusive of separately billable procedures and treating other patients.  Critical care was necessary to treat or prevent imminent or life-threatening deterioration.  Critical care was time spent personally by me on the following activities: development of treatment plan with patient and/or surrogate as well as nursing, discussions with consultants, evaluation of patient's response to treatment, examination of patient, obtaining history from patient or surrogate, ordering and performing treatments and interventions, ordering and review of laboratory studies, ordering and review of radiographic studies, pulse oximetry and re-evaluation of patient's condition.

## 2023-09-12 NOTE — Progress Notes (Signed)
 Chief Complaint: Follow up nausea and vomiting   HPI:    Alexandra [name] is an 23 year old Hispanic female, assigned to Dr. Charlanne, with a past medical history of Erler Danlos, POTS, who returns to clinic today for follow-up of nausea and vomiting.        04/14/2022 patient initially seen in the urgent care for generalized abdominal pain and sent to the ER. Described mid diffuse abdominal pain for 2 to 3 weeks. The symptoms were constant. At that time labs showed normal CMP, CBC and lipase. Patient had a CT of the abdomen pelvis which showed shotty subcentimeter mesenteric lymph nodes which were nonspecific but could be seen with mesenteric adenitis.     04/20/2022 patient seen in clinic by that me and discussed being diagnosed with IBS a few years ago she had had troubles with radiation of stools.  At that time recommend that she take MiraLAX  on a daily basis, discussed hopefully when she is having a regular stool she would have decreased left lower quadrant pain and nausea.  She had asked about Desipramine  and recommended that she discuss this with her primary care physician.  She asked for referral to a dietitian for weight loss and IBS.    06/26/2022 patient describes some stomach pain and nausea.  At that time was recommended she increase her Pantoprazole  to twice daily 40 mg before breakfast and dinner.    08/17/2022 patient seen in clinic and described that she thought the Pantoprazole  was helping but then she had another episode of nausea and some vomiting with epigastric pain and bloating.  She had been given Zofran  in the urgent care which seemed to help her symptoms some.  She had tried taking MiraLAX  capsules but was not taking them daily.  And she did take them and helped her have a bowel movement.  At that time recommended she continue her Pantoprazole  40 daily as the double dose did not help much.  Also recommend she take her MiraLAX  on a daily basis.  Ordered a right upper quadrant  ultrasound for the cycles of nausea and vomiting and discussed possible HIDA scan if necessary.  Also discussed possible EGD colonoscopy if testing was negative.    08/25/2022 right upper quadrant ultrasound was normal.    09/11/2022 HIDA scan with CCK was normal.    09/19/2022 EGD was normal with negative small bowel biopsies.  Positive biopsies for reflux.  Continued similar symptoms with epi pain, intermittent N/V without weight loss. More problems with pizzas Continued problems with constipation MiraLAX  not working as body is getting used to it.  She takes mag citrate when she gets severely constipated.  Denies fever, chills, blood in her stool, nausea or vomiting.  No melena or hematochezia  Blood tests from yesterday were all normal or baseline except ALT 39 (previously 91 08/2022).  Normal CBC with hemoglobin 14.4, Nl TSH   For anxiety/history of anorexia nervosa-follows with psychologist/psychiatrist at [facility] Readings from Last 3 Encounters:  09/12/23 266 lb (120.7 kg)  08/10/23 266 lb 3.2 oz (120.7 kg)  07/27/23 268 lb (121.6 kg)           Past Surgical History:  Procedure Laterality Date   DENTAL SURGERY       TYMPANOSTOMY TUBE PLACEMENT                Current Outpatient Medications  Medication Sig Dispense Refill   amoxicillin -clavulanate (AUGMENTIN ) 875-125 MG tablet Take 1 tablet by  mouth every 12 (twelve) hours. (Patient not taking: Reported on 09/14/2022) 14 tablet 0   atomoxetine  (STRATTERA ) 18 MG capsule Take 18 mg by mouth daily.       cephALEXin  (KEFLEX ) 500 MG capsule Take 1 capsule (500 mg total) by mouth 2 (two) times daily for 7 days. 14 capsule 0   fluconazole  (DIFLUCAN ) 150 MG tablet Take 1 tablet (150 mg total) by mouth daily. Take 2nd pill on 3rd day after first dose if still having symptoms. 2 tablet 0   FLUoxetine  (PROZAC ) 40 MG capsule Take 40 mg by mouth daily.       hydrOXYzine  (ATARAX ) 25 MG tablet Take 25 mg by mouth daily at 6 (six)  AM. Pt takes 1 table once a day.       hydrOXYzine  (ATARAX ) 50 MG tablet Take 1 tablet (50 mg total) by mouth at bedtime. 30 tablet 0   lidocaine  (LIDODERM ) 5 % Place 1 patch onto the skin daily. Remove & Discard patch within 12 hours or as directed by MD 30 patch 0   ondansetron  (ZOFRAN -ODT) 8 MG disintegrating tablet Take 1 tablet (8 mg total) by mouth every 8 (eight) hours as needed for nausea or vomiting. 30 tablet 0   pantoprazole  (PROTONIX ) 40 MG tablet Take 1 tablet by mouth 30 minutes before breakfast and evening meal 60 tablet 2   Prenatal Vit-Fe Fumarate-FA (PRENATAL VITAMIN PLUS LOW IRON ) 27-1 MG TABS Take 1 tablet by mouth daily at 12 noon. 90 tablet 3   propranolol  (INDERAL ) 40 MG tablet Take 1 tablet (40 mg total) by mouth 2 (two) times daily. 180 tablet 3   topiramate  (TOPAMAX ) 25 MG tablet Take 1 tablet (25 mg total) by mouth daily as needed. 90 tablet 3    No current facility-administered medications for this visit.           Allergies as of 09/21/2022 - Review Complete 09/19/2022  Allergen Reaction Noted   Bee pollen Other (See Comments) 03/21/2017   Methylprednisolone  Anxiety and Other (See Comments) 06/06/2022   Pollen extract   03/21/2017           Family History  Problem Relation Age of Onset   Hyperthyroidism Mother     Diabetes Mother     Asthma Father     Cataracts Sister     Strabismus Sister     Non-Hodgkin's lymphoma Brother     Cancer Brother     Colon cancer Neg Hx     Stomach cancer Neg Hx     Esophageal cancer Neg Hx     Colon polyps Neg Hx        Social History         Socioeconomic History   Marital status: Single      Spouse name: Not on file   Number of children: 0   Years of education: Not on file   Highest education level: Not on file  Occupational History   Occupation: Unemployed  Tobacco Use   Smoking status: Never      Passive exposure: Yes   Smokeless tobacco: Never   Tobacco comments:      family smokes outside  Vaping  Use   Vaping Use: Never used  Substance and Sexual Activity   Alcohol use: No   Drug use: No   Sexual activity: Not on file  Other Topics Concern   Not on file  Social History Narrative    [employer] 22-23 school - lives with  brother, sister, step father and mother.     Social Determinants of Health        Financial Resource Strain: Medium Risk (12/30/2021)    Overall Financial Resource Strain (CARDIA)     Difficulty of Paying Living Expenses: Somewhat hard  Food Insecurity: No Food Insecurity (12/30/2021)    Hunger Vital Sign     Worried About Running Out of Food in the Last Year: Never true     Ran Out of Food in the Last Year: Never true  Transportation Needs: Unmet Transportation Needs (03/06/2022)    PRAPARE - Therapist, Art (Medical): Yes     Lack of Transportation (Non-Medical): Yes  Physical Activity: Not on file  Stress: Not on file  Social Connections: Not on file  Intimate Partner Violence: Not on file      Review of Systems:    Constitutional: No weight loss, fever or chills Cardiovascular: No chest pain Respiratory: No SOB Gastrointestinal: See HPI and otherwise negative    Physical Exam:  Vital signs: BP (!) 100/58   Pulse (!) 57   Ht 5' 3 (1.6 m)   Wt 232 lb 7 oz (105.4 kg)   BMI 41.17 kg/m     Constitutional:   Pleasant obese female appears to be in NAD, Well developed, Well nourished, alert and cooperative Respiratory: Respirations even and unlabored. Lungs clear to auscultation bilaterally.   No wheezes, crackles, or rhonchi.  Cardiovascular: Normal S1, S2. No MRG. Regular rate and rhythm. No peripheral edema, cyanosis or pallor.  Gastrointestinal:  Soft, nondistended, nontender. No rebound or guarding. Normal bowel sounds. No appreciable masses or hepatomegaly. Rectal:  Not performed.  Psychiatric: Oriented to person, place and time. Demonstrates good judgement and reason without abnormal affect or behaviors.    RELEVANT LABS AND IMAGING: CBC Labs (Brief)          Component Value Date/Time    WBC 10.5 08/12/2022 1332    RBC 5.50 (H) 08/12/2022 1332    HGB 14.6 08/12/2022 1332    HCT 44.1 08/12/2022 1332    PLT 255 08/12/2022 1332    MCV 80.2 08/12/2022 1332    MCH 26.5 08/12/2022 1332    MCHC 33.1 08/12/2022 1332    RDW 13.6 08/12/2022 1332    LYMPHSABS 2.8 08/12/2022 1332    MONOABS 0.7 08/12/2022 1332    EOSABS 0.2 08/12/2022 1332    BASOSABS 0.0 08/12/2022 1332        CMP     Labs (Brief)          Component Value Date/Time    NA 139 08/12/2022 1332    K 4.3 08/12/2022 1332    CL 110 08/12/2022 1332    CO2 21 (L) 08/12/2022 1332    GLUCOSE 90 08/12/2022 1332    BUN 13 08/12/2022 1332    CREATININE 0.84 08/12/2022 1332    CREATININE 0.79 02/16/2022 1701    CALCIUM  9.3 08/12/2022 1332    PROT 7.9 08/12/2022 1332    ALBUMIN 3.9 08/12/2022 1332    AST 40 08/12/2022 1332    ALT 91 (H) 08/12/2022 1332    ALKPHOS 98 08/12/2022 1332    BILITOT 0.6 08/12/2022 1332    GFRNONAA >60 08/12/2022 1332    GFRAA NOT CALCULATED 03/21/2017 1856        Assessment: 1.  Left Lower abdo pain 2.  Epi pain with N/V (resolved)-likely functional, related anxiety- Extensive workup including EGD, ultrasound and  HIDA scan all unrevealing; likely functional 2.  IBS-C- Linzess  stopped d/t abdo cramps. 3.  Fatty liver 4.  Associated POTS, Ehlers-Danlos syndrome , obesity, anxiety, H/O anorexia nervosa   Plan: 1.  Continue Amitriptyline  10 mg QHS #30, 6RF 2.  Miralax  17g po QD 4.  Protonix  20mg  po every day #90 5.  CTA- (r/o any vascular abnormalities associated with Ehlers-Danlos syndrome ) 6.  FU in 6 months. If still with problems, GES to r/o gastroparesis      Alexandra Bring, MD [facility] [phone]

## 2023-09-12 NOTE — ED Provider Triage Note (Addendum)
 Emergency Medicine Provider Triage Evaluation Note  Alexandra [name] , a 23 y.o. female  was evaluated in triage.  Pt complains of headache.  Does have history of migraines.  But states at 430 she developed left-sided weakness and numbness.  Family member at bedside says her speech is off as well. Patient states last night she felt off.  No weakness or numbness at that time.  This morning she woke up with a headache.  Review of Systems  Positive: As above Negative: As above  Physical Exam  BP 113/80   Pulse 96   Temp 97.6 F (36.4 C) (Oral)   Resp 18   LMP  (LMP Unknown) Comment: nexplanon   SpO2 98%  Gen:   Awake, no distress   Resp:  Normal effort  MSK:   Moves extremities without difficulty  Other:  Limited range of motion in the left leg.  Decreased sensation in the left lower extremity.  Weakness in left upper extremity compared to the right upper extremity.  Medical Decision Making  Medically screening exam initiated at 7:41 PM.  Appropriate orders placed.  Alexandra [name] was informed that the remainder of the evaluation will be completed by another provider, this initial triage assessment does not replace that evaluation, and the importance of remaining in the ED until their evaluation is complete.  Code stroke activated given weakness and numbness starting 3 hours ago.   Hildegard Loge, PA-C 09/12/23 1942    Hildegard Loge, PA-C 09/12/23 1942

## 2023-09-13 ENCOUNTER — Other Ambulatory Visit: Payer: Self-pay

## 2023-09-13 ENCOUNTER — Telehealth: Payer: Self-pay | Admitting: Neurology

## 2023-09-13 DIAGNOSIS — R569 Unspecified convulsions: Secondary | ICD-10-CM

## 2023-09-13 NOTE — Telephone Encounter (Signed)
 Patient called to request an ASAP appt. She was in ED. We did receive a new referral from ED, I sent to [facility]. Shaking in her sleep,possible seizure? As well as headaches and numbness

## 2023-09-14 NOTE — Telephone Encounter (Signed)
 Called patient and informed her that Dr. Skeet is out of the office this week, However Dr. Evonnie has reviewed her records and She has  n/s her appt with Dr. Skeet in October.  Her EEG in the ER was neg.  Schedule 24 hour ambulatory EEG and then further recommendations when Dr. Skeet is back   Patient would like to proceed with the 24 hour ambulatory EEG and is aware this message will be sent to Dr. Skeet for further recommendations. Patient had no further questions or concerns.

## 2023-09-18 ENCOUNTER — Ambulatory Visit: Payer: MEDICAID | Admitting: Allergy and Immunology

## 2023-09-18 NOTE — Telephone Encounter (Signed)
 Called patient and left a message for a call back.

## 2023-09-19 ENCOUNTER — Other Ambulatory Visit (HOSPITAL_COMMUNITY): Payer: MEDICAID

## 2023-09-20 ENCOUNTER — Ambulatory Visit (HOSPITAL_COMMUNITY)
Admission: RE | Admit: 2023-09-20 | Discharge: 2023-09-20 | Disposition: A | Discharge status: Home / Self Care | Payer: MEDICAID | Source: Ambulatory Visit | Attending: Gastroenterology | Admitting: Gastroenterology

## 2023-09-20 DIAGNOSIS — Q796 Ehlers-Danlos syndrome, unspecified: Secondary | ICD-10-CM | POA: Insufficient documentation

## 2023-09-20 DIAGNOSIS — R1030 Lower abdominal pain, unspecified: Secondary | ICD-10-CM | POA: Insufficient documentation

## 2023-09-20 MED ORDER — SODIUM CHLORIDE (PF) 0.9 % IJ SOLN
INTRAMUSCULAR | Status: AC
  Filled 2023-09-20: qty 50

## 2023-09-20 MED ORDER — IOHEXOL 350 MG/ML SOLN
100.0000 mL | Freq: Once | INTRAVENOUS | Status: AC | PRN
  Administered 2023-09-20: 100 mL via INTRAVENOUS

## 2023-09-21 ENCOUNTER — Encounter: Payer: Self-pay | Admitting: Neurology

## 2023-09-21 NOTE — Telephone Encounter (Signed)
Called patient and informed her that per Dr. Everlena Cooper he agrees with the 24 hour EEG. Informed patient I resent a message to [facility] to have her scheduled. Patient also aware of [place] law stating no driving for 6 months from last seizure/possible seizure.   Patient verbalized understanding and had no further questions or concerns.

## 2023-10-04 ENCOUNTER — Encounter: Payer: Self-pay | Admitting: Neurology

## 2023-10-04 DIAGNOSIS — H538 Other visual disturbances: Secondary | ICD-10-CM

## 2023-10-04 DIAGNOSIS — R519 Headache, unspecified: Secondary | ICD-10-CM

## 2023-10-04 DIAGNOSIS — F419 Anxiety disorder, unspecified: Secondary | ICD-10-CM

## 2023-10-04 DIAGNOSIS — R002 Palpitations: Secondary | ICD-10-CM

## 2023-10-06 ENCOUNTER — Encounter: Payer: Self-pay | Admitting: Gastroenterology

## 2023-10-08 ENCOUNTER — Other Ambulatory Visit: Payer: Self-pay | Admitting: Nurse Practitioner

## 2023-10-08 MED ORDER — TERBINAFINE HCL 1 % EX CREA: 1.0000 | TOPICAL_CREAM | Freq: Two times a day (BID) | CUTANEOUS | 0 refills | Status: DC

## 2023-10-09 ENCOUNTER — Telehealth: Payer: Self-pay | Admitting: *Deleted

## 2023-10-14 NOTE — Progress Notes (Signed)
 NEUROLOGY FOLLOW UP OFFICE NOTE  Fernande Ortego [id number]  Assessment/Plan:   Migraine without aura, with status migrainosus, not intractable, suspect cervicogenic component Recurrent transient altered sensorium and shaking - consider complex partial seizures vs migraine aura   Check 72 hour ambulatory EEG.  She does not drive.   Migraine prevention:  restart topiramate  at 25mg  but to take every night at bedtime.  We can increase to 50mg  in 4 weeks if needed Migraine rescue:  she will try samples of Nurtec. May take cyclobenzaprine . Limit use of pain relievers to no more than 2 days out of week to prevent risk of rebound or medication-overuse headache. Keep headache diary Follow up 6 months.    Subjective:  Maciah Anzaldo Hernandez is a 23 year old female with Ehlers-Danlos disease, POTS and anxiety who follows up for headaches.  UPDATE: Started topiramate .   Rizatriptan  caused side effects. While on topiramate  100mg , she did not have migraines.  She ran out of refills in November-December.  Since then, they are occurring 2 to 3 times a week.  On 09/12/2023, she had a spell.  She develops a headache and blurred vision.  Her neck started twitching which spread to her eyes and then the right arm and leg with associated right arm and leg numbness.  She went in and out of consciousness, talks nonsensical and her heart was racing.  She developed on of her habitual migraine headaches with nausea and vomiting.  The shaking and altered sensorium lasted 15 minutes but headaches lasted around a couple of hours.  Seen in ED.  MRI of brain and cervical spine were unremarkable.  EEG was normal. She had 5 other similar episodes over the next couple of weeks.  Last event occurred on 1/18.     Current NSAIDS/analgesics:  none Current triptans:  none Current ergotamine:  none Current anti-emetic:  Zofran  4mg  Current muscle relaxants:  Flexeril  10mg  TID PRN Current Antihypertensive  medications:  propranolol  40mg  BID Current Antidepressant medications:  amitriptyline  10mg  QHS (for abdominal spasms - higher doses caused dizziness), fluoxetine  40mg  daily Current Anticonvulsant medications:  topiramate  100mg  at bedtime Current anti-CGRP:  none Current Vitamins/Herbal/Supplements:  none Current Antihistamines/Decongestants:  none Other therapy:  none Birth control:  none Other medications:  Strattera , hydroxyzine    Caffeine :  avoids due to palpitations Diet:  drinks a lot of water Depression:  stable; Anxiety:  stable Other pain:  EDS, neck pain Sleep hygiene:  usually okay  HISTORY: Onset:  Prior history of frequent headaches in 2022.  Subsequently resolved.  Restarted in March-April 2024.  Gradually getting worse Location:  Left occipital and then radiates forward and across forehead.  Quality:  squeezing, pounding Intensity:  3-9/10.  Aura:  absent Prodrome:  absent Associated symptoms:  Left eye blurred vision, nausea, vomiting.  She denies associated photophobia, phonophobia, unilateral numbness or weakness. Duration:  several hours (severe 1-3 hours) Frequency:  daily Frequency of abortive medication: Fioricet and cyclobenzaprine  daily for past week. 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  Otherwise, brain looks normal.  Followed by neurosurgery.  Finding unlikely to be cause of headaches.  Due to increased frequency and severity of headaches, she had a repeat MRI of brain with and without contrast on 12/20/2022 which was personally reviewed revealed stable 7 mm pineal cyst but  otherwise unremarkable.    Had a routine eye exam in early 2024 (prior to onset) which was fine  Past NSAIDS/analgesics:  ketorolac , ibuprofen , acetaminophen , Fioricet Past  abortive triptans:  sumatriptan  tab (caused head burning) Past abortive ergotamine:  none Past muscle relaxants:  tizanidine  Past anti-emetic:  none Past antihypertensive medications:  metoprolol  Past antidepressant medications:  venlafaxine , imipramine , desipramine  Past anticonvulsant medications:  none Past anti-CGRP:  none Past vitamins/Herbal/Supplements:  magnesium  oxide Past antihistamines/decongestants:  Zyrtec , Benadryl  Other past therapies:  none   Family history of headache:  no  PAST MEDICAL HISTORY: Past Medical History:  Diagnosis Date   Anxiety    Dry skin    Eating disorder    Ehlers-Danlos disease    Fatty liver    POTS (postural orthostatic tachycardia syndrome)    Vision abnormalities     MEDICATIONS: Current Outpatient Medications on File Prior to Visit  Medication Sig Dispense Refill   terbinafine  (LAMISIL ) 1 % cream Apply 1 Application topically 2 (two) times daily. 30 g 0   amitriptyline  (ELAVIL ) 10 MG tablet Take 1 tablet (10 mg total) by mouth at bedtime. 30 tablet 6   amitriptyline  (ELAVIL ) 10 MG tablet Take 1 tablet (10 mg total) by mouth at bedtime. 30 tablet 6   atomoxetine  (STRATTERA ) 18 MG capsule Take 18 mg by mouth daily.     cetirizine  (ZYRTEC ) 10 MG tablet Take 1 tablet (10 mg total) by mouth daily. 30 tablet 3   FLUoxetine  (PROZAC ) 40 MG capsule Take 40 mg by mouth daily.     fluticasone  (FLONASE ) 50 MCG/ACT nasal spray 2 sprays per nostril 3-7 times per week. 16 g 3   hydrOXYzine  (ATARAX ) 25 MG tablet Take 25 mg by mouth daily at 6 (six) AM. Pt takes 1 table once a day.     hydrOXYzine  (ATARAX ) 50 MG tablet Take 1 tablet (50 mg total) by mouth at bedtime. 30 tablet 0   linaclotide  (LINZESS ) 72 MCG capsule Take 1 capsule (72 mcg total) by mouth daily before breakfast. 30 capsule 2   Olopatadine  HCl (PATADAY ) 0.2 % SOLN Place 1 drop into both eyes daily as needed. 2.5 mL 3   pantoprazole  (PROTONIX ) 20 MG tablet Take 1 tablet (20 mg total) by  mouth daily. 90 tablet 3   Prenatal Vit-Fe Fumarate-FA (PRENATAL VITAMIN PLUS LOW IRON ) 27-1 MG TABS Take 1 tablet by mouth daily. 30 tablet 2   propranolol  (INDERAL ) 40 MG tablet TAKE 1 TABLET(40 MG) BY MOUTH TWICE DAILY 180 tablet 2   topiramate  (TOPAMAX ) 100 MG tablet Take 1 tablet (100 mg total) by mouth at bedtime. 30 tablet 0   No current facility-administered medications on file prior to visit.    ALLERGIES: Allergies  Allergen Reactions   Bee Pollen Itching and Cough    "seasonal allergies"   Methylprednisolone  Anxiety and Other (See Comments)    Became angry and had a "weird feeling"   Pollen Extract Hives, Itching and Cough    "seasonal allergies"    FAMILY HISTORY: Family History  Problem Relation Age of Onset   Hyperthyroidism Mother    Diabetes Mother    Asthma Father    Allergic rhinitis Sister    Asthma Sister    Cataracts Sister    Strabismus Sister    Allergic rhinitis Brother    Non-Hodgkin's lymphoma Brother    Cancer Brother    Asthma Maternal Uncle    Colon cancer Neg Hx    Stomach cancer Neg Hx  Esophageal cancer Neg Hx    Colon polyps Neg Hx       Objective:  Blood pressure (!) 136/59, height 5\' 4"  (1.626 m), weight 274 lb (124.3 kg), SpO2 98%. General: No acute distress.  Patient appears well-groomed.   Head:  Normocephalic/atraumatic Eyes:  Fundi examined but not visualized Neck: supple, no paraspinal tenderness, full range of motion Heart:  Regular rate and rhythm Neurological Exam: alert and oriented.  Speech fluent and not dysarthric, language intact.  CN II-XII intact. Bulk and tone normal, muscle strength 5/5 throughout.  Sensation to light touch intact.  Deep tendon reflexes 2+ throughout, toes downgoing.  Finger to nose testing intact.  Gait normal, Romberg negative.   Janne Members, DO  CC: Abbey Hobby, NP

## 2023-10-15 ENCOUNTER — Ambulatory Visit (INDEPENDENT_AMBULATORY_CARE_PROVIDER_SITE_OTHER): Payer: MEDICAID | Admitting: Neurology

## 2023-10-15 ENCOUNTER — Encounter: Payer: Self-pay | Admitting: Neurology

## 2023-10-15 VITALS — BP 136/59 | Ht 64.0 in | Wt 274.0 lb

## 2023-10-15 DIAGNOSIS — G43001 Migraine without aura, not intractable, with status migrainosus: Secondary | ICD-10-CM | POA: Diagnosis not present

## 2023-10-15 DIAGNOSIS — R569 Unspecified convulsions: Secondary | ICD-10-CM | POA: Diagnosis not present

## 2023-10-15 MED ORDER — TOPIRAMATE 50 MG PO TABS: ORAL_TABLET | ORAL | 0 refills | Status: DC

## 2023-10-15 NOTE — Patient Instructions (Addendum)
 Restart topiramate  - 50mg  tablet - take 1/2 tablet at bedtime for one week, then 1 tablet at bedtime for one week, then 2 tablets at bedtime.  Contact me for refills At earliest onset of migraine, take Nurtec (1 tablet daily as needed).  Let me know if it works 72 hour ambulatory EEG. Joie Narrow will give you a call to schedule.

## 2023-10-16 ENCOUNTER — Ambulatory Visit: Payer: MEDICAID | Admitting: Physician Assistant

## 2023-10-16 NOTE — Telephone Encounter (Signed)
Lets -Start Linzess 145 mcg every day #30, 6RF -Can stop MiraLAX (getting nauseated) -Amitriptyline 10 mg QHS #30, 6RF   Neg CTA 09/2023-reviewed. Let us know if she still has problems.  RG

## 2023-10-17 ENCOUNTER — Ambulatory Visit: Payer: MEDICAID | Admitting: Physician Assistant

## 2023-10-18 ENCOUNTER — Other Ambulatory Visit: Payer: Self-pay

## 2023-10-18 DIAGNOSIS — K5900 Constipation, unspecified: Secondary | ICD-10-CM

## 2023-10-18 MED ORDER — LINACLOTIDE 145 MCG PO CAPS: 145.0000 ug | ORAL_CAPSULE | Freq: Every day | ORAL | 6 refills | Status: AC

## 2023-10-18 MED ORDER — AMITRIPTYLINE HCL 10 MG PO TABS: 10.0000 mg | ORAL_TABLET | Freq: Every day | ORAL | 6 refills | Status: AC

## 2023-11-07 ENCOUNTER — Ambulatory Visit: Payer: MEDICAID | Admitting: Nurse Practitioner

## 2023-11-07 ENCOUNTER — Ambulatory Visit: Payer: MEDICAID | Attending: Nurse Practitioner

## 2023-11-07 DIAGNOSIS — R519 Headache, unspecified: Secondary | ICD-10-CM

## 2023-11-07 DIAGNOSIS — R002 Palpitations: Secondary | ICD-10-CM

## 2023-11-07 DIAGNOSIS — F419 Anxiety disorder, unspecified: Secondary | ICD-10-CM

## 2023-11-07 DIAGNOSIS — H538 Other visual disturbances: Secondary | ICD-10-CM

## 2023-11-16 ENCOUNTER — Ambulatory Visit: Payer: MEDICAID | Admitting: Neurology

## 2023-11-16 DIAGNOSIS — R569 Unspecified convulsions: Secondary | ICD-10-CM

## 2023-11-16 NOTE — Progress Notes (Signed)
 Ambulatory EEG hooked up and running. Light flashing. Push button tested. Camera and event log explained. Batteries explained. Patient understood.

## 2023-11-19 ENCOUNTER — Telehealth: Payer: Self-pay | Admitting: *Deleted

## 2023-11-19 NOTE — Progress Notes (Signed)
 AMB EEG discontinued. Patient had pulled off/scratched off majority of electrodes. Stated she thinks it happened Sunday. Skin Breakdown:Yes  at fp1, fp2 - minor scratches in head from patient scratching but not from electrode placement. Diary Returned: Yes

## 2023-11-30 ENCOUNTER — Encounter: Payer: Self-pay | Admitting: Neurology

## 2023-11-30 NOTE — Progress Notes (Addendum)
 ELECTROENCEPHALOGRAM REPORT  Dates of Recording: 11/16/2023 at 11:24:50 to 11/17/2023 at 23:52:42  Patient's Name: Alexandra [name] MRN: [id number] Date of Birth: 04/02/2001  Procedure: 36-hour ambulatory EEG.    History: 23 year old female with migraines presenting with spells of altered awareness and twitching.  Medications: propranolol, amitriptyline, fluoxetine, topiramate, Strattera, hydroxyzine  Technical Summary: This is a 36-hour multichannel digital EEG recording measured by the international 10-20 system with electrodes applied with paste and impedances below 5000 ohms performed as portable with EKG monitoring.  The digital EEG was referentially recorded, reformatted, and digitally filtered in a variety of bipolar and referential montages for optimal display.    DESCRIPTION OF RECORDING: 72 hour ambulatory EEG was ordered but patient prematurely pulled off the leads.  Video was not operable.  During maximal wakefulness, the background activity consisted of a symmetric 9-10Hz  posterior dominant rhythm which was reactive to eye opening.  There were no epileptiform discharges or focal slowing seen in wakefulness.  During the recording, the patient progresses through wakefulness, drowsiness, and Stage 2 sleep.  Again, there were no epileptiform discharges seen.  Events: Patient reported two events but did not document time.   Event 1:  twitching, jerking, staring off. Event 2:  "uncontrollable laughing, crying for awhile"  Reviewed EEG and, in particular, the recorded pushbutton events. There were no electrographic seizures seen.  EKG lead was unremarkable.  IMPRESSION: This 36-hour ambulatory EEG study is normal.    CLINICAL CORRELATION: Patient exhibited 2 events.  Based on description, one was habitual in nature.  Both events indicated non-epileptic events.     Shon Millet, DO

## 2023-12-10 ENCOUNTER — Ambulatory Visit (INDEPENDENT_AMBULATORY_CARE_PROVIDER_SITE_OTHER): Payer: MEDICAID | Admitting: Cardiology

## 2023-12-10 VITALS — Ht 64.0 in | Wt 282.2 lb

## 2023-12-10 DIAGNOSIS — R002 Palpitations: Secondary | ICD-10-CM | POA: Diagnosis not present

## 2023-12-10 DIAGNOSIS — R0789 Other chest pain: Secondary | ICD-10-CM

## 2023-12-10 DIAGNOSIS — H538 Other visual disturbances: Secondary | ICD-10-CM | POA: Diagnosis not present

## 2023-12-10 DIAGNOSIS — Z7182 Exercise counseling: Secondary | ICD-10-CM

## 2023-12-10 DIAGNOSIS — G90A Postural orthostatic tachycardia syndrome (POTS): Secondary | ICD-10-CM

## 2023-12-10 DIAGNOSIS — R6889 Other general symptoms and signs: Secondary | ICD-10-CM | POA: Diagnosis not present

## 2023-12-10 DIAGNOSIS — Z713 Dietary counseling and surveillance: Secondary | ICD-10-CM

## 2023-12-10 NOTE — Progress Notes (Signed)
 Cardiology Office Note:  .   Date:  12/10/2023  ID:  [name], DOB [birth year], MRN [id number] PCP: Jerrlyn Morel, NP  [facility] Providers Cardiologist:  Sheryle Donning, MD {  History of Present Illness: .   Alexandra [name] is a 23 y.o. female with PMH anxiety, depression, and POTS. She was previously followed by Dr. Ardell Beauvais and established care with me on 12/10/23.   Pertinent CV history: Initially seen by Dr. Ardell Beauvais 08/2021. At that visit, patient reported prior diagnosis of POTS and was undergoing workup for EDS.   Today: Having more frequent episodes of staring off into space, going nonresponsive. Undergoing neurology evaluation for this. Was told it was not seizures per neurology.   She feels like the POTS is getting worse as well. Sweating more. Can't exercise like she used to. Tried to use stepper for a few minutes, then felt very sick and vomited. No clear pattern as to what makes a good day vs. A bad day. Feels that her mood has been an issue but no other clear changes.   Feels like propranolol  helps, but she has noticed intermittent chest pain again. Uses compression stockings when she can. Has shortness of breath chronically with exertion. Sometimes feels like she is holding onto fluid. Also notes significant weight gain, had prior issues with eating disorder which she feels ruined her metabolism. Drinks >100 oz of water/day. No recent syncope but had these in the past.  ROS: Denies PND, orthopnea, LE edema. No syncope or palpitations. ROS otherwise negative except as noted.   Studies Reviewed: Aaron Aas    EKG:       Physical Exam:   VS:  Ht 5\' 4"  (1.626 m)   Wt 282 lb 3.2 oz (128 kg)   SpO2 98%   BMI 48.44 kg/m    Wt Readings from Last 3 Encounters:  12/10/23 282 lb 3.2 oz (128 kg)  10/15/23 274 lb (124.3 kg)  09/12/23 266 lb (120.7 kg)    Orthostatics attempted. She started feeling dizzy during these Lying: BP 104/70, HR  66 Sitting: BP 104/66, HR 66 Standing: BP 94/58, HR 79 Standing 3 min: BP 92/80, HR 78  GEN: Well nourished, well developed in no acute distress HEENT: Normal, moist mucous membranes NECK: No JVD CARDIAC: regular rhythm, normal S1 and S2, no rubs or gallops. No murmur. VASCULAR: Radial and DP pulses 2+ bilaterally. No carotid bruits RESPIRATORY:  Clear to auscultation without rales, wheezing or rhonchi  ABDOMEN: Soft, non-tender, non-distended MUSCULOSKELETAL:  Ambulates independently SKIN: Warm and dry, no edema NEUROLOGIC:  Alert and oriented x 3. No focal neuro deficits noted. PSYCHIATRIC:  Normal affect    ASSESSMENT AND PLAN: .    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 No genetic testing performed -not orthostatic by measurements today -reviewed management recommendations for POTS, see AVS -reviewed associated symptoms of chest pain, shortness of breath. Reviewed her echo, ETT, and monitors today. These do not suggest cardiac etiology for her chest pain/shortness of breath -discussed management strategies at length, including gradual increase in cardiovascular activity  Migraines Unclear unresponsive spells -being evaluated by neurology  Morbid obesity, BMI 48 -complicated by prior eating disorder. While GLP1RA typically would be a good choice, this can lead to exacerbations of restrictive eating. Would need to be monitored in a weight loss program with close follow up to monitor for this. May be better strategies. Recommend she  discuss options for programs with her PCP  Dispo: 6 months or sooner as needed  Signed, Sheryle Donning, MD   Sheryle Donning, MD, PhD, [facility]  [facility]  [place]  [facility] at [facility] at [facility] [address] [place], [place] [address] [phone]

## 2023-12-10 NOTE — Patient Instructions (Addendum)
 Medication Instructions:  Your physician recommends that you continue on your current medications as directed. Please refer to the Current Medication list given to you today.   *If you need a refill on your cardiac medications before your next appointment, please call your pharmacy*  Follow-Up: At [facility], you and your health needs are our priority.  As part of our continuing mission to provide you with exceptional heart care, our providers are all part of one team.  This team includes your primary Cardiologist (physician) and Advanced Practice Providers or APPs (Physician Assistants and Nurse Practitioners) who all work together to provide you with the care you need, when you need it.  Your next appointment:   6 month(s)  Provider:   Jodelle Red, MD    We recommend signing up for the patient portal called "MyChart".  Sign up information is provided on this After Visit Summary.  MyChart is used to connect with patients for Virtual Visits (Telemedicine).  Patients are able to view lab/test results, encounter notes, upcoming appointments, etc.  Non-urgent messages can be sent to your provider as well.   To learn more about what you can do with MyChart, go to [email or web address].   Other Instructions Postural Orthostatic Tachycardia Syndrome:  -avoid dehydration. Often it requires high volumes of fluids, often with salt/electrolytes included, to stay hydrated. People with POTS are very sensitive to fluid shifts and dehydration. Oral rehydration is preferred, and routine use of IV fluids is not recommended. -if tolerated, compression stocking can assist with fluid management and prevent pooling in the legs. -slow position changes are recommended -if there is a feeling of severe lightheadedness, like near to passing out, recommend lying on the floor on the back, with legs elevated up on a chair or up against the wall. -the best long term management of POTS symptoms is  gradual exercise conditioning. I recommend seated exercises such as bike to start, to avoid the risk of falling with lightheadedness. Exercise programs, either through supervised programs like cardiac rehab or through personal programs, should focus on gradually increasing exercise tolerance and conditioning.  -this is a link to specific exercise recommendations for POTS: http://peterson-powell.net/ -we discussed the typical spectrum of dysautonomia, including typical populations, that this sometimes spontaneously improves with age (though a small percentage have persistent symptoms), that this has uncomfortable symptoms but is not associated with long term mortality, and that the etiology/treatment of this is an area of active research

## 2023-12-11 ENCOUNTER — Telehealth: Payer: Self-pay

## 2023-12-11 DIAGNOSIS — R569 Unspecified convulsions: Secondary | ICD-10-CM

## 2023-12-11 NOTE — Telephone Encounter (Signed)
EMU ordered

## 2023-12-11 NOTE — Telephone Encounter (Signed)
 Spoke to the patient she is willing to do the EMU

## 2023-12-14 ENCOUNTER — Other Ambulatory Visit: Payer: Self-pay | Admitting: Nurse Practitioner

## 2023-12-14 DIAGNOSIS — E66813 Obesity, class 3: Secondary | ICD-10-CM

## 2023-12-15 ENCOUNTER — Encounter: Payer: Self-pay | Admitting: Neurology

## 2023-12-16 ENCOUNTER — Other Ambulatory Visit: Payer: Self-pay | Admitting: Neurology

## 2023-12-17 ENCOUNTER — Telehealth: Payer: Self-pay | Admitting: Pharmacy Technician

## 2023-12-17 ENCOUNTER — Encounter (INDEPENDENT_AMBULATORY_CARE_PROVIDER_SITE_OTHER): Payer: Self-pay

## 2023-12-17 ENCOUNTER — Other Ambulatory Visit: Payer: Self-pay

## 2023-12-17 MED ORDER — NURTEC 75 MG PO TBDP: 75.0000 mg | ORAL_TABLET | ORAL | 5 refills | Status: DC | PRN

## 2023-12-17 MED ORDER — TOPIRAMATE 50 MG PO TABS: ORAL_TABLET | ORAL | 0 refills | Status: DC

## 2023-12-17 NOTE — Telephone Encounter (Signed)
 Pharmacy Patient Advocate Encounter   Received notification from CoverMyMeds that prior authorization for NURTEC 75MG  is required/requested.   Insurance verification completed.   The patient is insured through [facility] .   Per test claim: PA required; PA submitted to above mentioned insurance via CoverMyMeds Key/confirmation #/EOC [id number] Status is pending

## 2023-12-18 ENCOUNTER — Other Ambulatory Visit (HOSPITAL_COMMUNITY): Payer: Self-pay

## 2023-12-19 ENCOUNTER — Other Ambulatory Visit (HOSPITAL_COMMUNITY): Payer: Self-pay

## 2023-12-19 NOTE — Telephone Encounter (Signed)
 Pharmacy Patient Advocate Encounter  Received notification from [facility] that Prior Authorization for NURTEC 75MG  has been APPROVED from 4.15.25 to 4.15.26. Ran test claim, Copay is $0. This test claim was processed through [facility]- copay amounts may vary at other pharmacies due to pharmacy/plan contracts, or as the patient moves through the different stages of their insurance plan.   PA #/Case ID/Reference #: [id number]

## 2023-12-21 ENCOUNTER — Encounter (HOSPITAL_BASED_OUTPATIENT_CLINIC_OR_DEPARTMENT_OTHER): Payer: Self-pay | Admitting: Cardiology

## 2023-12-21 DIAGNOSIS — R6889 Other general symptoms and signs: Secondary | ICD-10-CM | POA: Insufficient documentation

## 2023-12-21 DIAGNOSIS — R0789 Other chest pain: Secondary | ICD-10-CM | POA: Insufficient documentation

## 2023-12-26 ENCOUNTER — Telehealth: Payer: Self-pay

## 2023-12-26 NOTE — Telephone Encounter (Signed)
-----   Message from Parker,Christina sent at 12/20/2023  9:00 AM EDT ----- Regarding: RE: EMU Good morning, I called this patient twice to schedule and had to leave a VM both times. ----- Message ----- From: Michalene Agee, CMA Sent: 12/11/2023   3:50 PM EDT To: Glendora Landsman Subject: EMU                                            Good Afternoon, Dr.Jaffe would like to schedule this patient for a EMU.   Alyana Kreiter

## 2023-12-26 NOTE — Telephone Encounter (Signed)
 LMOVM for patient, and Mychart message sent.

## 2024-01-14 ENCOUNTER — Telehealth: Payer: Self-pay

## 2024-01-14 NOTE — Telephone Encounter (Signed)
 Sent to provider

## 2024-01-18 ENCOUNTER — Other Ambulatory Visit: Payer: Self-pay

## 2024-01-18 ENCOUNTER — Ambulatory Visit (INDEPENDENT_AMBULATORY_CARE_PROVIDER_SITE_OTHER): Payer: MEDICAID | Admitting: Nurse Practitioner

## 2024-01-18 ENCOUNTER — Encounter: Payer: Self-pay | Admitting: Nurse Practitioner

## 2024-01-18 ENCOUNTER — Ambulatory Visit (HOSPITAL_COMMUNITY)
Admission: RE | Admit: 2024-01-18 | Discharge: 2024-01-18 | Disposition: A | Discharge status: Home / Self Care | Payer: MEDICAID | Source: Ambulatory Visit | Attending: Nurse Practitioner | Admitting: Nurse Practitioner

## 2024-01-18 VITALS — BP 112/64 | HR 76 | Temp 98.1°F | Wt 279.4 lb

## 2024-01-18 DIAGNOSIS — M545 Low back pain, unspecified: Secondary | ICD-10-CM | POA: Insufficient documentation

## 2024-01-18 DIAGNOSIS — R519 Headache, unspecified: Secondary | ICD-10-CM

## 2024-01-18 MED ORDER — CYCLOBENZAPRINE HCL 10 MG PO TABS: 10.0000 mg | ORAL_TABLET | Freq: Three times a day (TID) | ORAL | 0 refills | Status: DC | PRN

## 2024-01-18 MED ORDER — KETOROLAC TROMETHAMINE 60 MG/2ML IM SOLN
60.0000 mg | Freq: Once | INTRAMUSCULAR | Status: AC
  Administered 2024-01-18: 60 mg via INTRAMUSCULAR

## 2024-01-18 NOTE — Progress Notes (Signed)
 dme

## 2024-01-18 NOTE — Patient Instructions (Signed)
 1. Acute midline low back pain without sciatica (Primary)  - DG Lumbar Spine 2-3 Views - ketorolac  (TORADOL ) injection 60 mg

## 2024-01-18 NOTE — Progress Notes (Addendum)
 Subjective   Patient ID: Alexandra [name], female    DOB: 16-Aug-2001, 22 y.o.   MRN: [id number]  Chief Complaint  Patient presents with   Medical Management of Chronic Issues    Patient is requesting a walker    Referring provider: Jerrlyn Morel, NP  Alexandra [name] is a 23 y.o. female with Past Medical History: No date: Anxiety No date: Dry skin No date: Eating disorder No date: Ehlers-Danlos disease No date: Fatty liver No date: POTS (postural orthostatic tachycardia syndrome) No date: Vision abnormalities   HPI  Patient presents today for an acute visit.  She states that she has been having low back pain.  She was using a yoga exercise ball and states that her back started hurting after using this.  We will check x-ray today.  We will give Toradol  injection in office today the pain is located to the low midline back.  Patient is requesting a walker today.  She states that she has also been having seizure-like activity and feels like she would be more stable with a walker. Denies f/c/s, n/v/d, hemoptysis, PND, leg swelling Denies chest pain or edema  Patient is requesting a walker today to help with mobility and stability due to decline in physical conditioning. Will place order today.  Allergies  Allergen Reactions   Bee Pollen Itching and Cough    "seasonal allergies"   Methylprednisolone  Anxiety and Other (See Comments)    Became angry and had a "weird feeling"   Pollen Extract Hives, Itching and Cough    "seasonal allergies"    Immunization History  Administered Date(s) Administered   Influenza,inj,Quad PF,6+ Mos 07/21/2019, 05/31/2021    Tobacco History: Social History   Tobacco Use  Smoking Status Never   Passive exposure: Yes  Smokeless Tobacco Never  Tobacco Comments   family smokes outside   Counseling given: Not Answered Tobacco comments: family smokes outside   Outpatient Encounter Medications as of 01/18/2024  Medication  Sig   amitriptyline  (ELAVIL ) 10 MG tablet Take 1 tablet (10 mg total) by mouth at bedtime.   atomoxetine  (STRATTERA ) 18 MG capsule Take 18 mg by mouth daily.   cetirizine  (ZYRTEC ) 10 MG tablet Take 1 tablet (10 mg total) by mouth daily.   cyclobenzaprine  (FLEXERIL ) 10 MG tablet Take 1 tablet (10 mg total) by mouth 3 (three) times daily as needed for muscle spasms.   FLUoxetine  (PROZAC ) 40 MG capsule Take 40 mg by mouth daily.   fluticasone  (FLONASE ) 50 MCG/ACT nasal spray 2 sprays per nostril 3-7 times per week.   hydrOXYzine  (ATARAX ) 25 MG tablet Take 25 mg by mouth daily at 6 (six) AM. Pt takes 1 table once a day.   hydrOXYzine  (ATARAX ) 50 MG tablet Take 1 tablet (50 mg total) by mouth at bedtime.   linaclotide  (LINZESS ) 145 MCG CAPS capsule Take 1 capsule (145 mcg total) by mouth daily before breakfast.   Olopatadine  HCl (PATADAY ) 0.2 % SOLN Place 1 drop into both eyes daily as needed.   pantoprazole  (PROTONIX ) 20 MG tablet Take 1 tablet (20 mg total) by mouth daily.   Prenatal Vit-Fe Fumarate-FA (PRENATAL VITAMIN PLUS LOW IRON ) 27-1 MG TABS Take 1 tablet by mouth daily.   propranolol  (INDERAL ) 40 MG tablet TAKE 1 TABLET(40 MG) BY MOUTH TWICE DAILY   Rimegepant Sulfate (NURTEC) 75 MG TBDP Take 1 tablet (75 mg total) by mouth as needed.   terbinafine  (LAMISIL ) 1 % cream Apply 1 Application topically 2 (two) times  daily.   topiramate  (TOPAMAX ) 50 MG tablet Take 1/2 tablet at bedtime for one week, then 1 tablet at bedtime for one week, then 2 tablets at bedtime   [EXPIRED] ketorolac  (TORADOL ) injection 60 mg    No facility-administered encounter medications on file as of 01/18/2024.    Review of Systems  Review of Systems  Constitutional: Negative.   HENT: Negative.    Cardiovascular: Negative.   Gastrointestinal: Negative.   Allergic/Immunologic: Negative.   Neurological: Negative.   Psychiatric/Behavioral: Negative.       Objective:   BP 112/64   Pulse 76   Temp 98.1 F (36.7  C) (Oral)   Wt 279 lb 6.4 oz (126.7 kg)   SpO2 98%   BMI 47.96 kg/m   Wt Readings from Last 5 Encounters:  01/18/24 279 lb 6.4 oz (126.7 kg)  12/10/23 282 lb 3.2 oz (128 kg)  10/15/23 274 lb (124.3 kg)  09/12/23 266 lb (120.7 kg)  08/10/23 266 lb 3.2 oz (120.7 kg)     Physical Exam Vitals and nursing note reviewed.  Constitutional:      General: She is not in acute distress.    Appearance: She is well-developed.  Cardiovascular:     Rate and Rhythm: Normal rate and regular rhythm.  Pulmonary:     Effort: Pulmonary effort is normal.     Breath sounds: Normal breath sounds.  Neurological:     Mental Status: She is alert and oriented to person, place, and time.       Assessment & Plan:   Acute midline low back pain without sciatica -     DG Lumbar Spine 2-3 Views -     Ketorolac  Tromethamine   Other orders -     Cyclobenzaprine  HCl; Take 1 tablet (10 mg total) by mouth 3 (three) times daily as needed for muscle spasms.  Dispense: 30 tablet; Refill: 0     Return if symptoms worsen or fail to improve.   Jerrlyn Morel, NP 01/18/2024

## 2024-01-21 ENCOUNTER — Inpatient Hospital Stay (HOSPITAL_COMMUNITY): Payer: MEDICAID

## 2024-01-21 ENCOUNTER — Ambulatory Visit: Payer: Self-pay | Admitting: Nurse Practitioner

## 2024-01-21 ENCOUNTER — Other Ambulatory Visit: Payer: Self-pay

## 2024-01-21 ENCOUNTER — Inpatient Hospital Stay (HOSPITAL_COMMUNITY)
Admission: RE | Admit: 2024-01-21 | Discharge: 2024-01-25 | DRG: 101 | Disposition: A | Discharge status: Home / Self Care | Payer: MEDICAID | Source: Ambulatory Visit | Attending: Neurology | Admitting: Neurology

## 2024-01-21 ENCOUNTER — Encounter (HOSPITAL_COMMUNITY): Payer: Self-pay | Admitting: Neurology

## 2024-01-21 DIAGNOSIS — Z825 Family history of asthma and other chronic lower respiratory diseases: Secondary | ICD-10-CM | POA: Diagnosis not present

## 2024-01-21 DIAGNOSIS — Z79899 Other long term (current) drug therapy: Secondary | ICD-10-CM

## 2024-01-21 DIAGNOSIS — K760 Fatty (change of) liver, not elsewhere classified: Secondary | ICD-10-CM | POA: Diagnosis present

## 2024-01-21 DIAGNOSIS — F419 Anxiety disorder, unspecified: Secondary | ICD-10-CM | POA: Diagnosis present

## 2024-01-21 DIAGNOSIS — Z833 Family history of diabetes mellitus: Secondary | ICD-10-CM | POA: Diagnosis not present

## 2024-01-21 DIAGNOSIS — R110 Nausea: Secondary | ICD-10-CM

## 2024-01-21 DIAGNOSIS — Z72820 Sleep deprivation: Secondary | ICD-10-CM

## 2024-01-21 DIAGNOSIS — R569 Unspecified convulsions: Principal | ICD-10-CM

## 2024-01-21 DIAGNOSIS — G43909 Migraine, unspecified, not intractable, without status migrainosus: Secondary | ICD-10-CM

## 2024-01-21 DIAGNOSIS — Q796 Ehlers-Danlos syndrome, unspecified: Secondary | ICD-10-CM | POA: Diagnosis not present

## 2024-01-21 DIAGNOSIS — F32A Depression, unspecified: Secondary | ICD-10-CM | POA: Diagnosis present

## 2024-01-21 DIAGNOSIS — G4089 Other seizures: Secondary | ICD-10-CM | POA: Diagnosis present

## 2024-01-21 DIAGNOSIS — G90A Postural orthostatic tachycardia syndrome (POTS): Secondary | ICD-10-CM | POA: Diagnosis present

## 2024-01-21 LAB — CBC WITH DIFFERENTIAL/PLATELET
Abs Immature Granulocytes: 0.03 10*3/uL (ref 0.00–0.07)
Basophils Absolute: 0 10*3/uL (ref 0.0–0.1)
Basophils Relative: 0 %
Eosinophils Absolute: 0.2 10*3/uL (ref 0.0–0.5)
Eosinophils Relative: 3 %
HCT: 38.7 % (ref 36.0–46.0)
Hemoglobin: 13 g/dL (ref 12.0–15.0)
Immature Granulocytes: 0 %
Lymphocytes Relative: 40 %
Lymphs Abs: 3 10*3/uL (ref 0.7–4.0)
MCH: 26.7 pg (ref 26.0–34.0)
MCHC: 33.6 g/dL (ref 30.0–36.0)
MCV: 79.6 fL — ABNORMAL LOW (ref 80.0–100.0)
Monocytes Absolute: 0.6 10*3/uL (ref 0.1–1.0)
Monocytes Relative: 8 %
Neutro Abs: 3.6 10*3/uL (ref 1.7–7.7)
Neutrophils Relative %: 49 %
Platelets: 238 10*3/uL (ref 150–400)
RBC: 4.86 MIL/uL (ref 3.87–5.11)
RDW: 12.8 % (ref 11.5–15.5)
WBC: 7.4 10*3/uL (ref 4.0–10.5)
nRBC: 0 % (ref 0.0–0.2)

## 2024-01-21 LAB — PROTIME-INR
INR: 1 (ref 0.8–1.2)
Prothrombin Time: 13.3 s (ref 11.4–15.2)

## 2024-01-21 LAB — RAPID URINE DRUG SCREEN, HOSP PERFORMED
Amphetamines: NOT DETECTED
Barbiturates: NOT DETECTED
Benzodiazepines: NOT DETECTED
Cocaine: NOT DETECTED
Opiates: NOT DETECTED
Tetrahydrocannabinol: POSITIVE — AB

## 2024-01-21 LAB — COMPREHENSIVE METABOLIC PANEL WITH GFR
ALT: 34 U/L (ref 0–44)
AST: 22 U/L (ref 15–41)
Albumin: 3.5 g/dL (ref 3.5–5.0)
Alkaline Phosphatase: 74 U/L (ref 38–126)
Anion gap: 9 (ref 5–15)
BUN: 11 mg/dL (ref 6–20)
CO2: 20 mmol/L — ABNORMAL LOW (ref 22–32)
Calcium: 8.8 mg/dL — ABNORMAL LOW (ref 8.9–10.3)
Chloride: 108 mmol/L (ref 98–111)
Creatinine, Ser: 0.88 mg/dL (ref 0.44–1.00)
GFR, Estimated: >60 mL/min (ref >60)
Glucose, Bld: 86 mg/dL (ref 70–99)
Potassium: 3.4 mmol/L — ABNORMAL LOW (ref 3.5–5.1)
Sodium: 137 mmol/L (ref 135–145)
Total Bilirubin: 0.9 mg/dL (ref 0.0–1.2)
Total Protein: 6.4 g/dL — ABNORMAL LOW (ref 6.5–8.1)

## 2024-01-21 LAB — MAGNESIUM: Magnesium: 2 mg/dL (ref 1.7–2.4)

## 2024-01-21 LAB — PHOSPHORUS: Phosphorus: 3.5 mg/dL (ref 2.5–4.6)

## 2024-01-21 MED ORDER — SODIUM CHLORIDE 0.9% FLUSH: 10.0000 mL | INTRAVENOUS | Status: DC | PRN

## 2024-01-21 MED ORDER — LINACLOTIDE 145 MCG PO CAPS
145.0000 ug | ORAL_CAPSULE | Freq: Every day | ORAL | Status: DC
Start: 2024-01-22 — End: 2024-01-25
  Administered 2024-01-22 – 2024-01-25 (×4): 145 ug via ORAL
  Filled 2024-01-21 (×4): qty 1

## 2024-01-21 MED ORDER — ENOXAPARIN SODIUM 60 MG/0.6ML IJ SOSY
60.0000 mg | PREFILLED_SYRINGE | INTRAMUSCULAR | Status: DC
  Administered 2024-01-21 – 2024-01-24 (×4): 60 mg via SUBCUTANEOUS
  Filled 2024-01-21 (×5): qty 0.6

## 2024-01-21 MED ORDER — PANTOPRAZOLE SODIUM 20 MG PO TBEC
20.0000 mg | DELAYED_RELEASE_TABLET | Freq: Every day | ORAL | Status: DC
  Administered 2024-01-21 – 2024-01-25 (×5): 20 mg via ORAL
  Filled 2024-01-21 (×5): qty 1

## 2024-01-21 MED ORDER — ACETAMINOPHEN 650 MG RE SUPP: 650.0000 mg | RECTAL | Status: DC | PRN

## 2024-01-21 MED ORDER — MIDAZOLAM HCL 2 MG/2ML IJ SOLN: 2.0000 mg | INTRAMUSCULAR | Status: DC | PRN

## 2024-01-21 MED ORDER — SODIUM CHLORIDE 0.9% FLUSH
10.0000 mL | Freq: Two times a day (BID) | INTRAVENOUS | Status: DC
  Administered 2024-01-21: 20 mL
  Administered 2024-01-21 – 2024-01-24 (×5): 10 mL

## 2024-01-21 MED ORDER — ACETAMINOPHEN 325 MG PO TABS
650.0000 mg | ORAL_TABLET | ORAL | Status: DC | PRN
  Administered 2024-01-22: 650 mg via ORAL
  Filled 2024-01-21 (×2): qty 2

## 2024-01-21 MED ORDER — FLUOXETINE HCL 20 MG PO CAPS
40.0000 mg | ORAL_CAPSULE | Freq: Every day | ORAL | Status: DC
  Administered 2024-01-21 – 2024-01-25 (×5): 40 mg via ORAL
  Filled 2024-01-21 (×5): qty 2

## 2024-01-21 MED ORDER — AMITRIPTYLINE HCL 10 MG PO TABS
10.0000 mg | ORAL_TABLET | Freq: Every day | ORAL | Status: DC
  Administered 2024-01-21 – 2024-01-24 (×4): 10 mg via ORAL
  Filled 2024-01-21 (×4): qty 1

## 2024-01-21 MED ORDER — PRENATAL MULTIVITAMIN CH
1.0000 | ORAL_TABLET | Freq: Every day | ORAL | Status: DC
  Administered 2024-01-21 – 2024-01-25 (×5): 1 via ORAL
  Filled 2024-01-21 (×5): qty 1

## 2024-01-21 MED ORDER — SODIUM CHLORIDE 0.9% FLUSH
3.0000 mL | Freq: Two times a day (BID) | INTRAVENOUS | Status: DC
  Administered 2024-01-21 – 2024-01-24 (×8): 3 mL via INTRAVENOUS

## 2024-01-21 MED ORDER — PROPRANOLOL HCL 10 MG PO TABS
40.0000 mg | ORAL_TABLET | Freq: Two times a day (BID) | ORAL | Status: DC
  Administered 2024-01-21 – 2024-01-25 (×9): 40 mg via ORAL
  Filled 2024-01-21 (×9): qty 4

## 2024-01-21 MED ORDER — KETOROLAC TROMETHAMINE 30 MG/ML IJ SOLN
30.0000 mg | Freq: Four times a day (QID) | INTRAMUSCULAR | Status: DC | PRN
  Administered 2024-01-21 – 2024-01-24 (×5): 30 mg via INTRAVENOUS
  Filled 2024-01-21 (×5): qty 1

## 2024-01-21 MED ORDER — ONDANSETRON HCL 4 MG PO TABS
8.0000 mg | ORAL_TABLET | Freq: Three times a day (TID) | ORAL | Status: DC | PRN
  Administered 2024-01-24: 8 mg via ORAL
  Filled 2024-01-21: qty 2

## 2024-01-21 MED ORDER — LORATADINE 10 MG PO TABS
10.0000 mg | ORAL_TABLET | Freq: Every day | ORAL | Status: DC
  Administered 2024-01-21 – 2024-01-25 (×5): 10 mg via ORAL
  Filled 2024-01-21 (×5): qty 1

## 2024-01-21 MED ORDER — DIPHENHYDRAMINE HCL 50 MG/ML IJ SOLN
25.0000 mg | Freq: Once | INTRAMUSCULAR | Status: AC
  Administered 2024-01-21: 25 mg via INTRAVENOUS
  Filled 2024-01-21: qty 1

## 2024-01-21 MED ORDER — LABETALOL HCL 5 MG/ML IV SOLN: 5.0000 mg | INTRAVENOUS | Status: DC | PRN

## 2024-01-21 MED ORDER — FLUTICASONE PROPIONATE 50 MCG/ACT NA SUSP: 2.0000 | Freq: Every day | NASAL | Status: DC | PRN

## 2024-01-21 NOTE — Progress Notes (Signed)
 Transition of Care [facility]) - Inpatient Brief Assessment   Patient Details  Name: Alexandra [name] MRN: [id number] Date of Birth: [birth year]  [facility] CM/SW Contact:    Jannine Meo, RN Phone Number: 01/21/2024, 1:54 PM   Clinical Narrative:  Patient from home with seizure like activity. Patient was in epilepsy monitoring unit and transferred to unit today.  Transition of Care Asessment: Insurance and Status: (P) Insurance coverage has been reviewed Patient has primary care physician: (P) Yes Home environment has been reviewed: (P) Home Prior level of function:: (P) Independent Prior/Current Home Services: (P) No current home services Social Drivers of Health Review: (P) SDOH reviewed needs interventions (Food insecurity) Readmission risk has been reviewed: (P) Yes Transition of care needs: (P) transition of care needs identified, TOC will continue to follow

## 2024-01-21 NOTE — Plan of Care (Signed)
 Discussed the need for her to call for assistance to bathroom. Meds and foods well tolerated. Denies any pain at this time.    Problem: Education: Goal: Knowledge of General Education information will improve Description: Including pain rating scale, medication(s)/side effects and non-pharmacologic comfort measures Outcome: Progressing   Problem: Activity: Goal: Risk for activity intolerance will decrease Outcome: Progressing   Problem: Coping: Goal: Level of anxiety will decrease Outcome: Progressing   Problem: Pain Managment: Goal: General experience of comfort will improve and/or be controlled Outcome: Progressing   Problem: Safety: Goal: Ability to remain free from injury will improve Outcome: Progressing   Problem: Skin Integrity: Goal: Risk for impaired skin integrity will decrease Outcome: Progressing

## 2024-01-21 NOTE — Progress Notes (Signed)
 LTM EEG hooked up and running - no initial skin breakdown - push button tested - [facility] monitoring.

## 2024-01-21 NOTE — Progress Notes (Signed)
 New admission arriving to unit via WC. Alert and orient. Verbalizes needs.

## 2024-01-21 NOTE — H&P (Addendum)
 CC: seizures  History is obtained from: Patient, chart review  HPI: Alexandra [name] is a 23 y.o. female with past medical history of migraines, Ehlers-Danlos syndrome, anxiety who was admitted to epilepsy monitoring unit for characterization of seizure-like episodes.  Patient states in January she had an episode of whole body shaking, feeling like she zoned out.  Next morning she had migraine as well as left lower extremity numbness and came to the emergency room.  She had a routine EEG and MRI brain which were normal and negative and therefore referred for outpatient neurology evaluation.  She was seen by Dr. Festus Hubert who did 36-hour ambulatory EEG monitoring which was within normal limits.  She has been on Topamax  100 mg nightly.  However these episodes have persisted.  States now she has typically whole body jerking when she is asleep and intermittent twitching of arms or legs associated with feeling like she zoning out during daytime.  States the frequency of these episodes is variable but has had about 3 episodes in the last 2 weeks.  Denies any aura or warning signs but does report having a headache around these episodes.  Denies any other recent medication changes.   ROS: All other systems reviewed and negative except as noted in the HPI.   Past Medical History:  Diagnosis Date   Anxiety    Dry skin    Eating disorder    Ehlers-Danlos disease    Fatty liver    POTS (postural orthostatic tachycardia syndrome)    Vision abnormalities      Family History  Problem Relation Age of Onset   Hyperthyroidism Mother    Diabetes Mother    Asthma Father    Allergic rhinitis Sister    Asthma Sister    Cataracts Sister    Strabismus Sister    Allergic rhinitis Brother    Non-Hodgkin's lymphoma Brother    Cancer Brother    Asthma Maternal Uncle    Colon cancer Neg Hx    Stomach cancer Neg Hx    Esophageal cancer Neg Hx    Colon polyps Neg Hx     Social History:  reports that  she has never smoked. She has been exposed to tobacco smoke. She has never used smokeless tobacco. She reports that she does not drink alcohol and does not use drugs.   Medications Prior to Admission  Medication Sig Dispense Refill Last Dose/Taking   amitriptyline  (ELAVIL ) 10 MG tablet Take 1 tablet (10 mg total) by mouth at bedtime. 30 tablet 6    atomoxetine  (STRATTERA ) 18 MG capsule Take 18 mg by mouth daily.      cetirizine  (ZYRTEC ) 10 MG tablet Take 1 tablet (10 mg total) by mouth daily. 30 tablet 3    cyclobenzaprine  (FLEXERIL ) 10 MG tablet Take 1 tablet (10 mg total) by mouth 3 (three) times daily as needed for muscle spasms. 30 tablet 0    FLUoxetine  (PROZAC ) 40 MG capsule Take 40 mg by mouth daily.      fluticasone  (FLONASE ) 50 MCG/ACT nasal spray 2 sprays per nostril 3-7 times per week. 16 g 3    hydrOXYzine  (ATARAX ) 25 MG tablet Take 25 mg by mouth daily at 6 (six) AM. Pt takes 1 table once a day.      hydrOXYzine  (ATARAX ) 50 MG tablet Take 1 tablet (50 mg total) by mouth at bedtime. 30 tablet 0    linaclotide  (LINZESS ) 145 MCG CAPS capsule Take 1 capsule (145 mcg total) by mouth  daily before breakfast. 30 capsule 6    Olopatadine  HCl (PATADAY ) 0.2 % SOLN Place 1 drop into both eyes daily as needed. 2.5 mL 3    pantoprazole  (PROTONIX ) 20 MG tablet Take 1 tablet (20 mg total) by mouth daily. 90 tablet 3    Prenatal Vit-Fe Fumarate-FA (PRENATAL VITAMIN PLUS LOW IRON ) 27-1 MG TABS Take 1 tablet by mouth daily. 30 tablet 2    propranolol  (INDERAL ) 40 MG tablet TAKE 1 TABLET(40 MG) BY MOUTH TWICE DAILY 180 tablet 2    Rimegepant Sulfate (NURTEC) 75 MG TBDP Take 1 tablet (75 mg total) by mouth as needed. 16 tablet 5    terbinafine  (LAMISIL ) 1 % cream Apply 1 Application topically 2 (two) times daily. 30 g 0    topiramate  (TOPAMAX ) 50 MG tablet Take 1/2 tablet at bedtime for one week, then 1 tablet at bedtime for one week, then 2 tablets at bedtime 60 tablet 0       Exam: Current vital  signs: There were no vitals taken for this visit. Vital signs in last 24 hours:     Physical Exam  Constitutional: Appears well-developed and well-nourished.  Psych: Affect appropriate to situation Neuro: AO x 3, no aphasia, cranial nerves grossly intact, 5/5 in all 4 extremities, sensation intact to light touch, FTN intact bilaterally   I have reviewed labs in epic and the results pertinent to this consultation are: CBC: No results for input(s): "WBC", "NEUTROABS", "HGB", "HCT", "MCV", "PLT" in the last 168 hours.  Basic Metabolic Panel:  Lab Results  Component Value Date   NA 139 09/12/2023   K 4.6 09/12/2023   CO2 22 09/12/2023   GLUCOSE 82 09/12/2023   BUN 11 09/12/2023   CREATININE 0.80 09/12/2023   CALCIUM  8.9 09/12/2023   GFRNONAA >60 09/12/2023   GFRAA NOT CALCULATED 03/21/2017   Lipid Panel:  Lab Results  Component Value Date   LDLCALC 134 (H) 10/25/2022   HgbA1c:  Lab Results  Component Value Date   HGBA1C 5.4 10/25/2022   Urine Drug Screen:     Component Value Date/Time   LABOPIA NONE DETECTED 04/14/2022 1840   COCAINSCRNUR NONE DETECTED 04/14/2022 1840   COCAINSCRNUR Negative 12/04/2017 1630   LABBENZ NONE DETECTED 04/14/2022 1840   AMPHETMU NONE DETECTED 04/14/2022 1840   THCU NONE DETECTED 04/14/2022 1840   LABBARB NONE DETECTED 04/14/2022 1840    Alcohol Level     Component Value Date/Time   ETH <10 09/12/2023 2012     I have reviewed the images obtained:  CT head without contrast 09/12/2023: No acute abnormality  MRI brain with and without contrast 09/13/2023: No acute abnormality    ASSESSMENT/PLAN: 23 year old female with seizure-like episodes admitted to epilepsy monitoring unit for characterization of spells.  Seizures - Start video EEG monitoring for characterization of spells - Will hold topiramate .  Topamax  levels ordered and pending - Will plan for hyperventilation, photic stimulation and sleep deprivation tomorrow - As needed  IV Versed  for seizure - Seizure precautions  Depression Migraine Anxiety - Continue home medications  Nausea - Patient reported nausea this morning.  Will order as needed Zofran   Roxy Cordial Epilepsy [facility]

## 2024-01-21 NOTE — Progress Notes (Signed)

## 2024-01-22 ENCOUNTER — Inpatient Hospital Stay (HOSPITAL_COMMUNITY): Payer: MEDICAID

## 2024-01-22 ENCOUNTER — Encounter (HOSPITAL_COMMUNITY): Payer: MEDICAID

## 2024-01-22 DIAGNOSIS — F419 Anxiety disorder, unspecified: Secondary | ICD-10-CM | POA: Diagnosis not present

## 2024-01-22 DIAGNOSIS — G43909 Migraine, unspecified, not intractable, without status migrainosus: Secondary | ICD-10-CM | POA: Diagnosis not present

## 2024-01-22 DIAGNOSIS — R569 Unspecified convulsions: Secondary | ICD-10-CM | POA: Diagnosis not present

## 2024-01-22 DIAGNOSIS — F32A Depression, unspecified: Secondary | ICD-10-CM | POA: Diagnosis not present

## 2024-01-22 LAB — MISC LABCORP TEST (SEND OUT): Labcorp test code: 83935

## 2024-01-22 LAB — TOPIRAMATE LEVEL: Topiramate Lvl: <1.5 ug/mL — ABNORMAL LOW (ref 2.0–25.0)

## 2024-01-22 NOTE — Progress Notes (Signed)
 Subjective: No acute events overnight.  No new concerns.  States she had 1 episode where she felt like her arm was jerking but did not mark it on the EEG and does not remember what time it was.  ROS: negative except above=  Examination  Vital signs in last 24 hours: Temp:  [97.8 F (36.6 C)-98.6 F (37 C)] 97.8 F (36.6 C) (05/20 0823) Pulse Rate:  [57-69] 63 (05/20 0907) Resp:  [16-21] 16 (05/20 0341) BP: (100-114)/(53-60) 104/55 (05/20 0907) SpO2:  [98 %-100 %] 99 % (05/20 0823) Weight:  [126.6 kg] 126.6 kg (05/19 1238)  Constitutional: Appears well-developed and well-nourished.  Psych: Affect appropriate to situation Neuro: AO x 3, no aphasia, cranial nerves grossly intact, 5/5 in all 4 extremities, sensation intact to light touch, FTN intact bilaterally  Basic Metabolic Panel: Recent Labs  Lab 01/21/24 1021  NA 137  K 3.4*  CL 108  CO2 20*  GLUCOSE 86  BUN 11  CREATININE 0.88  CALCIUM  8.8*  MG 2.0  PHOS 3.5    CBC: Recent Labs  Lab 01/21/24 1021  WBC 7.4  NEUTROABS 3.6  HGB 13.0  HCT 38.7  MCV 79.6*  PLT 238     Coagulation Studies: Recent Labs    01/21/24 1021  LABPROT 13.3  INR 1.0    Imaging No new imaging overnight   ASSESSMENT AND PLAN;23 year old female with seizure-like episodes admitted to epilepsy monitoring unit for characterization of spells.   Seizures -Continue video EEG monitoring for characterization of spells -Continue to hold topiramate  - Plan for hyperventilation, photic stimulation and sleep deprivation today -Discussed overnight EEG findings and encouraged patient to press the seizure button for any seizure-like episodes - As needed IV Versed  for seizure - Seizure precautions   Depression Migraine Anxiety - Continue home medications   I have spent a total of  35  minutes with the patient reviewing hospital notes,  test results, labs and examining the patient as well as establishing an assessment and plan that was  discussed personally with the patient.  > 50% of time was spent in direct patient care.       Roxy Cordial Epilepsy [facility] For questions after 5pm please refer to AMION to reach the Neurologist on call

## 2024-01-22 NOTE — Plan of Care (Signed)
  Problem: Education: Goal: Knowledge of General Education information will improve Description: Including pain rating scale, medication(s)/side effects and non-pharmacologic comfort measures Outcome: Progressing   Problem: Health Behavior/Discharge Planning: Goal: Ability to manage health-related needs will improve Outcome: Progressing   Problem: Clinical Measurements: Goal: Ability to maintain clinical measurements within normal limits will improve Outcome: Progressing   Problem: Clinical Measurements: Goal: Complications related to the disease process, condition or treatment will be avoided or minimized Outcome: Progressing

## 2024-01-22 NOTE — Progress Notes (Signed)
 Activation procedures complete. Photic stimulation and HV.

## 2024-01-22 NOTE — Procedures (Signed)
 Patient Name: Alexandra [name]  MRN: [id number]  Epilepsy Attending: Arleene Lack  Referring Physician/Provider: Arleene Lack, MD  Duration: 01/21/2024 0815 to 01/22/2024 0815  Patient history: 23 year old female with seizure-like episodes admitted to epilepsy monitoring unit for characterization of spells. EEG to evaluate for seizure  Level of alertness: Awake, asleep  AEDs during EEG study: None  Technical aspects: This EEG study was done with scalp electrodes positioned according to the 10-20 International system of electrode placement. Electrical activity was reviewed with band pass filter of 1-70Hz , sensitivity of 7 uV/mm, display speed of 72mm/sec with a 60Hz  notched filter applied as appropriate. EEG data were recorded continuously and digitally stored.  Video monitoring was available and reviewed as appropriate.  Description: The posterior dominant rhythm consists of 9 Hz activity of moderate voltage (25-35 uV) seen predominantly in posterior head regions, symmetric and reactive to eye opening and eye closing.  Sleep was characterized by vertex waves, sleep spindles (12 to 14 Hz), maximal frontocentral region. Hyperventilation and photic stimulation were not performed.     IMPRESSION: This study is within normal limits. No seizures or epileptiform discharges were seen throughout the recording.  A normal interictal EEG does not exclude the diagnosis of epilepsy.  Chapel Silverthorn O Britlyn Martine

## 2024-01-22 NOTE — Plan of Care (Signed)
 Discussed the need to stay awake today and during the night. Assisted to bathroom.     Problem: Education: Goal: Knowledge of General Education information will improve Description: Including pain rating scale, medication(s)/side effects and non-pharmacologic comfort measures Outcome: Progressing   Problem: Clinical Measurements: Goal: Diagnostic test results will improve Outcome: Progressing   Problem: Activity: Goal: Risk for activity intolerance will decrease Outcome: Progressing   Problem: Nutrition: Goal: Adequate nutrition will be maintained Outcome: Progressing   Problem: Safety: Goal: Ability to remain free from injury will improve Outcome: Progressing   Problem: Skin Integrity: Goal: Risk for impaired skin integrity will decrease Outcome: Progressing   Problem: Education: Goal: Expressions of having a comfortable level of knowledge regarding the disease process will increase Outcome: Progressing   Problem: Safety: Goal: Verbalization of understanding the information provided will improve Outcome: Progressing

## 2024-01-23 ENCOUNTER — Encounter (HOSPITAL_COMMUNITY): Payer: MEDICAID

## 2024-01-23 DIAGNOSIS — G43909 Migraine, unspecified, not intractable, without status migrainosus: Secondary | ICD-10-CM | POA: Diagnosis not present

## 2024-01-23 DIAGNOSIS — R569 Unspecified convulsions: Secondary | ICD-10-CM | POA: Diagnosis not present

## 2024-01-23 DIAGNOSIS — F32A Depression, unspecified: Secondary | ICD-10-CM | POA: Diagnosis not present

## 2024-01-23 DIAGNOSIS — F419 Anxiety disorder, unspecified: Secondary | ICD-10-CM | POA: Diagnosis not present

## 2024-01-23 MED ORDER — DIPHENHYDRAMINE HCL 50 MG/ML IJ SOLN
25.0000 mg | Freq: Once | INTRAMUSCULAR | Status: AC
  Administered 2024-01-23: 25 mg via INTRAVENOUS
  Filled 2024-01-23: qty 1

## 2024-01-23 NOTE — Plan of Care (Signed)
   Problem: Education: Goal: Knowledge of General Education information will improve Description: Including pain rating scale, medication(s)/side effects and non-pharmacologic comfort measures Outcome: Progressing   Problem: Health Behavior/Discharge Planning: Goal: Ability to manage health-related needs will improve Outcome: Progressing   Problem: Coping: Goal: Level of anxiety will decrease Outcome: Progressing   Problem: Pain Managment: Goal: General experience of comfort will improve and/or be controlled Outcome: Progressing   Problem: Safety: Goal: Ability to remain free from injury will improve Outcome: Progressing

## 2024-01-23 NOTE — Procedures (Addendum)
 Patient Name: Alexandra [name]  MRN: [id number]  Epilepsy Attending: Arleene Lack  Referring Physician/Provider: Arleene Lack, MD  Duration: 01/22/2024 0815 to 01/23/2024 0815   Patient history: 23 year old female with seizure-like episodes admitted to epilepsy monitoring unit for characterization of spells. EEG to evaluate for seizure   Level of alertness: Awake, asleep   AEDs during EEG study: None   Technical aspects: This EEG study was done with scalp electrodes positioned according to the 10-20 International system of electrode placement. Electrical activity was reviewed with band pass filter of 1-70Hz , sensitivity of 7 uV/mm, display speed of 57mm/sec with a 60Hz  notched filter applied as appropriate. EEG data were recorded continuously and digitally stored.  Video monitoring was available and reviewed as appropriate.   Description: The posterior dominant rhythm consists of 9 Hz activity of moderate voltage (25-35 uV) seen predominantly in posterior head regions, symmetric and reactive to eye opening and eye closing.  Sleep was characterized by vertex waves, sleep spindles (12 to 14 Hz), maximal frontocentral region. Photic driving was seen during photic stimulation. No EEG change was seen during hyperventilation.  Event button was pressed on 01/22/2024 at 1408.  Patient reported twitching in left shoulder and left lower extremity. Concomitant EEG before, during and after the event did not show any EEG change to suggest seizure.  After around  2000 on 01/22/2024, parts of study were difficult to interpret due to significant electrode artifact.  IMPRESSION: This study is within normal limits. No seizures or epileptiform discharges were seen throughout the recording.  One event was reported on 01/22/2024 at 1408 during which patient reported twitching in left shoulder and left lower extremity without concomitant EEG change.  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   Holden Draughon O Chantalle Defilippo

## 2024-01-23 NOTE — Progress Notes (Signed)
 Pt c/o itching during the night. Requested benadryl , as she had received a dose the night before for generalized itchiness. Pt c/o chronic back pain, received tylenol  and toradol  with moderate relief. Pt stated she had recently started flexeril  10mg  at home with relief.  MD contacted and ordered Kpad. Request placed with [facility] and when they find one they will deliver to unit.  Pt denied aura's or seizure activity during the night. Pt states she usually gets headaches (mostly R sided), numbness/tingling and/or tremors prior to seizure activity.  Some EEG leads came off during the night. Message left on EEG voicemail for tech to reapply. No replacement of leads by 0700.

## 2024-01-23 NOTE — Progress Notes (Signed)
 Subjective: No acute events overnight. Reports an episode of left upper shoulder and lower extremity subtle twitching yesterday without concomitant EEG change  ROS: negative except above  Examination  Vital signs in last 24 hours: Temp:  [97.7 F (36.5 C)-98.5 F (36.9 C)] 97.8 F (36.6 C) (05/21 0735) Pulse Rate:  [57-66] 62 (05/21 0735) Resp:  [16-17] 17 (05/21 0735) BP: (95-115)/(57-67) 104/59 (05/21 0735) SpO2:  [98 %-100 %] 100 % (05/21 0735)  Constitutional: Appears well-developed and well-nourished.  Psych: Affect appropriate to situation Neuro: AO x 3, no aphasia, cranial nerves grossly intact, 5/5 in all 4 extremities, sensation intact to light touch, FTN intact bilaterally  Basic Metabolic Panel: Recent Labs  Lab 01/21/24 1021  NA 137  K 3.4*  CL 108  CO2 20*  GLUCOSE 86  BUN 11  CREATININE 0.88  CALCIUM  8.8*  MG 2.0  PHOS 3.5    CBC: Recent Labs  Lab 01/21/24 1021  WBC 7.4  NEUTROABS 3.6  HGB 13.0  HCT 38.7  MCV 79.6*  PLT 238     Coagulation Studies: Recent Labs    01/21/24 1021  LABPROT 13.3  INR 1.0    Imaging No new imaging overnight     ASSESSMENT AND PLAN;23 year old female with seizure-like episodes admitted to epilepsy monitoring unit for characterization of spells.   Seizures -Continue video EEG monitoring for characterization of spells -Continue to hold topiramate  - Plan for sleep deprivation today -Discussed overnight EEG findings and encouraged patient to press the seizure button for any seizure-like episodes - As needed IV Versed  for seizure - Seizure precautions   Depression Migraine Anxiety - Continue home medications   I have spent a total of  26  minutes with the patient reviewing hospital notes,  test results, labs and examining the patient as well as establishing an assessment and plan that was discussed personally with the patient.  > 50% of time was spent in direct patient care.      Roxy Cordial Epilepsy [facility] For questions after 5pm please refer to AMION to reach the Neurologist on call

## 2024-01-24 ENCOUNTER — Inpatient Hospital Stay (HOSPITAL_COMMUNITY): Payer: MEDICAID

## 2024-01-24 DIAGNOSIS — F32A Depression, unspecified: Secondary | ICD-10-CM | POA: Diagnosis not present

## 2024-01-24 DIAGNOSIS — F419 Anxiety disorder, unspecified: Secondary | ICD-10-CM | POA: Diagnosis not present

## 2024-01-24 DIAGNOSIS — R569 Unspecified convulsions: Secondary | ICD-10-CM | POA: Diagnosis not present

## 2024-01-24 DIAGNOSIS — G43909 Migraine, unspecified, not intractable, without status migrainosus: Secondary | ICD-10-CM | POA: Diagnosis not present

## 2024-01-24 MED ORDER — DIPHENHYDRAMINE HCL 50 MG/ML IJ SOLN
25.0000 mg | Freq: Once | INTRAMUSCULAR | Status: AC
  Administered 2024-01-24: 25 mg via INTRAVENOUS
  Filled 2024-01-24: qty 1

## 2024-01-24 MED ORDER — TOPIRAMATE 100 MG PO TABS
100.0000 mg | ORAL_TABLET | Freq: Every day | ORAL | Status: DC
  Administered 2024-01-24: 100 mg via ORAL
  Filled 2024-01-24: qty 1

## 2024-01-24 NOTE — Procedures (Signed)
 Patient Name: Alexandra [name]  MRN: [id number]  Epilepsy Attending: Arleene Lack  Referring Physician/Provider: Arleene Lack, MD  Duration: 01/23/2024 0815 to 01/24/2024 0815   Patient history: 23 year old female with seizure-like episodes admitted to epilepsy monitoring unit for characterization of spells. EEG to evaluate for seizure   Level of alertness: Awake, asleep   AEDs during EEG study: None   Technical aspects: This EEG study was done with scalp electrodes positioned according to the 10-20 International system of electrode placement. Electrical activity was reviewed with band pass filter of 1-70Hz , sensitivity of 7 uV/mm, display speed of 57mm/sec with a 60Hz  notched filter applied as appropriate. EEG data were recorded continuously and digitally stored.  Video monitoring was available and reviewed as appropriate.   Description: The posterior dominant rhythm consists of 9 Hz activity of moderate voltage (25-35 uV) seen predominantly in posterior head regions, symmetric and reactive to eye opening and eye closing.  Sleep was characterized by vertex waves, sleep spindles (12 to 14 Hz), maximal frontocentral region.    IMPRESSION: This study is within normal limits. No seizures or epileptiform discharges were seen throughout the recording.   A normal interictal EEG does not exclude the diagnosis of epilepsy.   Mikaele Stecher O Hakim Minniefield

## 2024-01-24 NOTE — Progress Notes (Signed)
 LTM maint complete - no skin breakdown under:  FP1, FP2, 02. Mild skin irritation

## 2024-01-24 NOTE — Progress Notes (Signed)
 Subjective: NAEO. Had some eye pain and headache due to sleep deprivation.   ROS: negative except above  Examination  Vital signs in last 24 hours: Temp:  [97.8 F (36.6 C)-98.2 F (36.8 C)] 97.8 F (36.6 C) (05/22 1246) Pulse Rate:  [55-68] 68 (05/22 1246) Resp:  [12-20] 18 (05/22 1246) BP: (103-115)/(57-74) 106/60 (05/22 1246) SpO2:  [94 %-100 %] 97 % (05/22 1246)  General: lying in bed, NAD Neuro: MS: Alert, oriented, follows commands CN: pupils equal and reactive,  EOMI, face symmetric, tongue midline, normal sensation over face, Motor: 5/5 strength in all 4 extremities Coordination: normal Gait: not tested  Basic Metabolic Panel: Recent Labs  Lab 01/21/24 1021  NA 137  K 3.4*  CL 108  CO2 20*  GLUCOSE 86  BUN 11  CREATININE 0.88  CALCIUM  8.8*  MG 2.0  PHOS 3.5    CBC: Recent Labs  Lab 01/21/24 1021  WBC 7.4  NEUTROABS 3.6  HGB 13.0  HCT 38.7  MCV 79.6*  PLT 238     Coagulation Studies: No results for input(s): "LABPROT", "INR" in the last 72 hours.  Imaging No new imaging overnight     ASSESSMENT AND PLAN;23 year old female with seizure-like episodes admitted to epilepsy monitoring unit for characterization of spells.   Seizures -Continue video EEG monitoring for characterization of spells -Resume topiramate  100mg  at bedtime tonight -Discussed overnight EEG findings and potential diagnosis of non-epileptic spells. All questions answered - As needed IV Versed  for seizure - Seizure precautions   Depression Migraine Anxiety - Continue home medications   I have spent a total of  36  minutes with the patient reviewing hospital notes,  test results, labs and examining the patient as well as establishing an assessment and plan that was discussed personally with the patient.  > 50% of time was spent in direct patient care.   Roxy Cordial Epilepsy [facility] For questions after 5pm please refer to AMION to reach the  Neurologist on call

## 2024-01-25 ENCOUNTER — Ambulatory Visit (HOSPITAL_COMMUNITY)
Admission: RE | Admit: 2024-01-25 | Discharge: 2024-01-25 | Disposition: A | Discharge status: Home / Self Care | Payer: MEDICAID | Source: Ambulatory Visit | Attending: Neurology | Admitting: Neurology

## 2024-01-25 DIAGNOSIS — R569 Unspecified convulsions: Secondary | ICD-10-CM | POA: Diagnosis not present

## 2024-01-25 MED ORDER — DIPHENHYDRAMINE HCL 50 MG/ML IJ SOLN
25.0000 mg | Freq: Once | INTRAMUSCULAR | Status: AC
  Administered 2024-01-25: 25 mg via INTRAVENOUS
  Filled 2024-01-25: qty 1

## 2024-01-25 NOTE — TOC Transition Note (Signed)
 Transition of Care [facility]) - Discharge Note   Patient Details  Name: Alexandra [name] MRN: [id number] Date of Birth: [birth year]  Transition of Care [facility] CM/SW Contact:  Jonathan Neighbor, RN Phone Number: 01/25/2024, 8:53 AM   Clinical Narrative:     Pt is discharging home with self care. No needs per TOC.   Final next level of care: Home/Self Care Barriers to Discharge: No Barriers Identified   Patient Goals and CMS Choice            Discharge Placement                       Discharge Plan and Services Additional resources added to the After Visit Summary for                                       Social Drivers of Health (SDOH) Interventions SDOH Screenings   Food Insecurity: Food Insecurity Present (01/21/2024)  Housing: Low Risk  (01/21/2024)  Transportation Needs: No Transportation Needs (01/21/2024)  Utilities: Not At Risk (01/21/2024)  Alcohol Screen: Low Risk  (07/09/2017)  Depression (PHQ2-9): Low Risk  (07/27/2023)  Financial Resource Strain: Medium Risk (12/30/2021)  Tobacco Use: Medium Risk (01/21/2024)     Readmission Risk Interventions     No data to display

## 2024-01-25 NOTE — Procedures (Addendum)
 Patient Name: Alexandra [name]  MRN: [id number]  Epilepsy Attending: Arleene Lack  Referring Physician/Provider: Arleene Lack, MD  Duration: 01/24/2024 0815 to 01/25/2024 7829   Patient history: 23 year old female with seizure-like episodes admitted to epilepsy monitoring unit for characterization of spells. EEG to evaluate for seizure   Level of alertness: Awake, asleep   AEDs during EEG study: None   Technical aspects: This EEG study was done with scalp electrodes positioned according to the 10-20 International system of electrode placement. Electrical activity was reviewed with band pass filter of 1-70Hz , sensitivity of 7 uV/mm, display speed of 57mm/sec with a 60Hz  notched filter applied as appropriate. EEG data were recorded continuously and digitally stored.  Video monitoring was available and reviewed as appropriate.   Description: The posterior dominant rhythm consists of 9 Hz activity of moderate voltage (25-35 uV) seen predominantly in posterior head regions, symmetric and reactive to eye opening and eye closing.  Sleep was characterized by vertex waves, sleep spindles (12 to 14 Hz), maximal frontocentral region.   Intermittently during night while patient was asleep, she was noted to have brief head jerk. Concomitant EEG did not show any EEG changes any seizure.  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    IMPRESSION: This study is within normal limits. No seizures or epileptiform discharges were seen throughout the recording.  Intermittently during the night when patient was asleep, she was noted to have brief head jerk without concomitant EEG change.  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   Maygen Sirico O Kyra Laffey

## 2024-01-25 NOTE — Discharge Instructions (Signed)
 You were admitted to epilepsy monitoring unit between 01/21/2024 to 01/25/2024.  During this time, you underwent continuous video EEG monitoring.  Your antiseizure medications were held.  Photic stimulation, hyperventilation and sleep deprivation were performed.  1 brief episode of upper and lower extremity jerking were recorded without EEG change.  This was not an epileptic event.  You are also noted to have jerking of your head during sleep consistent with sleep myoclonus.  Your EEG did not show any epileptiform discharges.  We discussed that rarely patients with epilepsy can have normal EEG.  However it is also possible that you were episodes are nonepileptic and recommended cognitive behavioral therapy with your psychiatrist/therapist.  In the time we will continue your Topamax .  Seizure precaution including no driving were discussed.  Please continue to follow-up with your neurologist

## 2024-01-25 NOTE — Discharge Summary (Signed)
 Physician Discharge Summary  Patient ID: Alexandra [name] MRN: [id number] DOB/AGE: [birth year] 23 y.o.  Admit date: 01/21/2024 Discharge date: 01/25/2024  Admission Diagnoses: Seizure  Discharge Diagnoses: Seizure  Discharged Condition: stable  Hospital Course: Alexandra [name] was admitted to epilepsy monitoring unit between 01/21/2024 to 01/25/2024.  During this time, she underwent continuous video EEG monitoring.  Hyperventilation, photic stimulation and sleep deprivation were performed.  Topamax  was held.  1 event was recorded on 01/22/2024 at 1408 during which patient reported twitching in left shoulder and left lower extremity without concomitant EEG change.  This was most likely a nonepileptic event.  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  Rest of the EEG was within normal limits without any ictal-interictal abnormality.  We discussed the possibility of nonepileptic spells and recommended cognitive behavioral therapy.  We also discussed that at times people with epilepsy can have normal EEG and therefore if episodes persist or worsen, we can consider repeat EMU admission.  We also discussed potentially trying zonisamide if Topamax  does not help.  Additionally we discussed potential teratogenic effects of Topamax  and counseled about contraception.  Patient states she has a Nexplanon  and does not plan to have any kids in the near future.  Lastly seizure precautions including no driving were discussed.  She will continue to follow-up with her outpatient neurologist.  Consults: None  Significant Diagnostic Studies:   Description: The posterior dominant rhythm consists of 9 Hz activity of moderate voltage (25-35 uV) seen predominantly in posterior head regions, symmetric and reactive to eye opening and eye closing.  Sleep was characterized by vertex waves, sleep spindles (12 to 14 Hz), maximal frontocentral region. Photic driving was seen during photic  stimulation. No EEG change was seen during hyperventilation.   Event button was pressed on 01/22/2024 at 1408.  Patient reported twitching in left shoulder and left lower extremity. Concomitant EEG before, during and after the event did not show any EEG change to suggest seizure.  Intermittently during night while patient was asleep, she was noted to have brief head jerk. Concomitant EEG did not show any EEG changes any seizure.  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 No seizures or epileptiform discharges were seen throughout the recording.   One event was reported on 01/22/2024 at 1408 during which patient reported twitching in left shoulder and left lower extremity without concomitant EEG change.  This was most likely a nonepileptic event.  Intermittently during the night when patient was asleep, she was noted to have brief head jerk without concomitant EEG change.  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  Treatments: Continue Topamax  100 mg daily  Discharge Exam: Blood pressure (!) 110/54, pulse 72, temperature 97.8 F (36.6 C), temperature source Oral, resp. rate 16, height 5\' 4"  (1.626 m), weight 126.6 kg, SpO2 99%.  Physical exam General: lying in bed, NAD Neuro: Alexandra: Alert, oriented, follows commands CN: pupils equal and reactive,  EOMI, face symmetric, tongue midline, normal sensation over face, Motor: 5/5 strength in all 4 extremities Coordination: normal Gait: not tested  Discharge disposition: 01-Home or Self Care   Discharge Instructions     Call MD for:   Complete by: As directed    If patient has another seizure, call 911 and bring them back to the ED if: A.  The seizure lasts longer  than 5 minutes.      B.  The patient doesn't wake shortly after the seizure or has new problems such as difficulty seeing, speaking or moving following the seizure C.   The patient was injured during the seizure D.  The patient has a temperature over 102 F (39C) E.  The patient vomited during the seizure and now is having trouble breathing      Diet - low sodium heart healthy   Complete by: As directed    Discharge instructions   Complete by: As directed    During the Seizure   - First, ensure adequate ventilation and place patients on the floor on their left side  Loosen clothing around the neck and ensure the airway is patent. If the patient is clenching the teeth, do not force the mouth open with any object as this can cause severe damage - Remove all items from the surrounding that can be hazardous. The patient may be oblivious to what's happening and may not even know what he or she is doing. If the patient is confused and wandering, either gently guide him/her away and block access to outside areas - Reassure the individual and be comforting - Call 911. In most cases, the seizure ends before EMS arrives. However, there are cases when seizures may last over 3 to 5 minutes. Or the individual may have developed breathing difficulties or severe injuries. If a pregnant patient or a person with diabetes develops a seizure, it is prudent to call an ambulance.    After the Seizure (Postictal Stage)   After a seizure, most patients experience confusion, fatigue, muscle pain and/or a headache. Thus, one should permit the individual to sleep. For the next few days, reassurance is essential. Being calm and helping reorient the person is also of importance.   Most seizures are painless and end spontaneously. Seizures are not harmful to others but can lead to complications such as stress on the lungs, brain and the heart. Individuals with prior lung problems may develop labored breathing and respiratory distress.        Increase activity slowly   Complete by: As directed    Other Restrictions   Complete by: As directed    Seizure precautions: Per [facility] statutes, patients with seizures are not allowed to drive until they have been seizure-free for six months and cleared by a physician    Use caution when using heavy equipment or power tools. Avoid working on ladders or at heights. Take showers instead of baths. Ensure the water temperature is not too high on the home water heater. Do not go swimming alone. Do not lock yourself in a room alone (i.e. bathroom). When caring for infants or small children, sit down when holding, feeding, or changing them to minimize risk of injury to the child in the event you have a seizure. Maintain good sleep hygiene. Avoid alcohol.         Allergies as of 01/25/2024       Reactions   Bee Pollen Itching, Cough   "seasonal allergies"   Methylprednisolone  Anxiety, Other (See Comments)   Became angry and had a "weird feeling"   Pollen Extract Hives, Itching, Cough   "seasonal allergies"        Medication List     TAKE these medications    amitriptyline  10 MG tablet Commonly known as: ELAVIL  Take 1 tablet (10 mg total) by mouth at bedtime.   atomoxetine  18 MG capsule  Commonly known as: STRATTERA  Take 18 mg by mouth daily.   cetirizine  10 MG tablet Commonly known as: ZYRTEC  Take 1 tablet (10 mg total) by mouth daily.   cyclobenzaprine  10 MG tablet Commonly known as: FLEXERIL  Take 1 tablet (10 mg total) by mouth 3 (three) times daily as needed for muscle spasms.   FLUoxetine  40 MG capsule Commonly known as: PROZAC  Take 40 mg by mouth daily.   fluticasone  50 MCG/ACT nasal spray Commonly known as: FLONASE  2 sprays per nostril 3-7 times per week. What changed:  how much to take how to take this when to take this reasons to take this   hydrOXYzine  25 MG tablet Commonly known as: ATARAX  Take 25 mg by mouth daily at 6 (six) AM. Pt takes 1 table once a day.   hydrOXYzine  50 MG tablet Commonly known as: ATARAX  Take 1 tablet (50 mg total) by mouth at bedtime.   linaclotide   145 MCG Caps capsule Commonly known as: Linzess  Take 1 capsule (145 mcg total) by mouth daily before breakfast.   Nurtec 75 MG Tbdp Generic drug: Rimegepant Sulfate Take 1 tablet (75 mg total) by mouth as needed. What changed: reasons to take this   Olopatadine  HCl 0.2 % Soln Commonly known as: Pataday  Place 1 drop into both eyes daily as needed. What changed: reasons to take this   pantoprazole  20 MG tablet Commonly known as: Protonix  Take 1 tablet (20 mg total) by mouth daily.   Prenatal Vitamin Plus Low Iron  27-1 MG Tabs Take 1 tablet by mouth daily.   propranolol  40 MG tablet Commonly known as: INDERAL  TAKE 1 TABLET(40 MG) BY MOUTH TWICE DAILY What changed: See the new instructions.   terbinafine  1 % cream Commonly known as: LAMISIL  Apply 1 Application topically 2 (two) times daily.   topiramate  50 MG tablet Commonly known as: Topamax  Take 1/2 tablet at bedtime for one week, then 1 tablet at bedtime for one week, then 2 tablets at bedtime What changed:  how much to take how to take this when to take this       I have spent a total of   36 minutes with the patient reviewing hospital notes,  test results, labs and examining the patient as well as establishing an assessment and plan that was discussed personally with the patient.  > 50% of time was spent in direct patient care.       Signed: Shanequia Kendrick O Tailor Westfall 01/25/2024, 8:51 AM

## 2024-01-25 NOTE — Progress Notes (Signed)
 vLTM discontinued  [facility] notified,  Patient removed leads and left hospital before EEG staff was able to remove leads for her

## 2024-01-26 ENCOUNTER — Other Ambulatory Visit: Payer: Self-pay | Admitting: Neurology

## 2024-01-30 ENCOUNTER — Encounter (INDEPENDENT_AMBULATORY_CARE_PROVIDER_SITE_OTHER): Payer: MEDICAID | Admitting: Adult Health

## 2024-02-20 ENCOUNTER — Other Ambulatory Visit: Payer: Self-pay

## 2024-02-20 DIAGNOSIS — R0781 Pleurodynia: Secondary | ICD-10-CM

## 2024-02-20 DIAGNOSIS — M545 Low back pain, unspecified: Secondary | ICD-10-CM

## 2024-02-20 NOTE — Progress Notes (Signed)
 dme

## 2024-03-07 ENCOUNTER — Encounter: Payer: Self-pay | Admitting: Neurology

## 2024-03-17 ENCOUNTER — Encounter: Payer: Self-pay | Admitting: Neurology

## 2024-03-24 ENCOUNTER — Encounter: Payer: Self-pay | Admitting: Neurology

## 2024-04-09 ENCOUNTER — Other Ambulatory Visit (INDEPENDENT_AMBULATORY_CARE_PROVIDER_SITE_OTHER): Payer: MEDICAID

## 2024-04-09 MED ORDER — WEGOVY 0.25 MG/0.5ML ~~LOC~~ SOAJ: 0.2500 mg | SUBCUTANEOUS | 0 refills | Status: DC

## 2024-04-09 NOTE — Addendum Note (Signed)
 Addended by: BRINDA LORAIN SQUIBB on: 04/09/2024 05:32 PM   Modules accepted: Orders

## 2024-04-09 NOTE — Progress Notes (Signed)
 04/09/2024 Name: Alexandra [name] MRN: [id number] DOB: Jun 30, 2001  Chief Complaint  Patient presents with   Weight Management Screening    Alexandra [name] is a 23 y.o. year old female who presented for a telephone visit.   They were referred to the pharmacist by their PCP for assistance in managing weight management.  PMH includes POTS, GERD, Ehlers-Danlos syndrome, anorexia nervosa with bulimia (2018), MDD w/ hx of suicidal ideation, ADHD, PTSD, HLD, BMI > 30.    Subjective: Patient was last seen by PCP, Bascom Borer, NP, on 01/18/24. At last visit, patient reported acute low back pain. She also self-reported seizure like activity. She was admitted on 01/21/24 for an EEG without abnormalities.   Today, patient reports doing well. Patient reports that she initially talked with Tonya about starting a weight loss medication back in April. She had a referral to [facility], but she had to cancel the appt because she wasn't able to pay the $90 entrance fee. She reports that recently she has increased physical activity. She has also been trying to implement portion control. Reports that she has had symptoms c/w binge eating in the past, but reports that she has not been engaging with this behavior recently. Despite these lifestyle changes, she reports she is not seeing changes in her weight. She reports that she is well connected with psychiatry who she sees every 2 months, and a therapist who she sees every 2 weeks. Her mother is also an important part of her support system and monitors her eating behaviors to ensure she doesn't relapse. She reports that she has talked with her psychiatrist about her frustrations with inability to lose weight. Her psychiatrist suggested that she may be a candidate for an injectable weight loss medication. She has also expressed this desire to her counselor.. She also reports that she feels she has more pain, and cannot exercise as much as she wants, because of  her extra weight.   Care Team: Primary Care Provider: Borer Bascom RAMAN, NP ; Next Scheduled Visit: Needs to be scheduled Psychiatrist: Dr. Shirline: typically sees every 2 months Therapist: Sees every 2 weeks  Medication Access/Adherence  Current Pharmacy:  [id number] [address], [place] - [address] AT [facility] & [address] [place] [address] Phone: [phone] Fax: [phone]  [facility] [address] [place] [address] Phone: [phone] Fax: [phone]   Patient reports affordability concerns with their medications: Yes  - unable to afford [facility] program Patient reports access/transportation concerns to their pharmacy: No  Patient reports adherence concerns with their medications:  No    Obesity/Overweight:   Patient reports that she had anorexia when she was young. As she came out of this condition, she began to struggle with binge eating. Feels that through counseling, she is more self aware. Reports that her mother helps her monitor for any signs of these conditions recurring. She has discussed the possibility of starting a weight loss medication with her mother, and while she was initially hesitant, she would like for her to have the benefits of possibly increased mobility and decreased pain. Reports that she feels her Ehlers-Danlos diagnosis is worsened by her weight as well.   Current medications: none  Weight Management treatments previously prescribed: none  Current meal patterns: Currently eating 2-3 meals per day. She eats with her family. Reports that she has been trying to increase her protein  intake recently.  - Drinks: Reports that she is able to stay hydrated with water throughout the day. Drinks > 64 oz water per day. Occasionally will have juice or soda.   Current physical activity: Has been going to the gym with her sister. Reports she is limited by pain - recently  had a knee injury, which she feels is exacerbated by her weight  Current medication access support: Diabetes in her mother, Diabetes in her grandmother  Objective:  BMI Readings from Last 3 Encounters:  01/21/24 47.89 kg/m  01/18/24 47.96 kg/m  12/10/23 48.44 kg/m   Wt Readings from Last 3 Encounters:  01/21/24 279 lb (126.6 kg)  01/18/24 279 lb 6.4 oz (126.7 kg)  12/10/23 282 lb 3.2 oz (128 kg)    BP Readings from Last 3 Encounters:  01/25/24 (!) 110/54  01/18/24 112/64  10/15/23 (!) 136/59    Lab Results  Component Value Date   HGBA1C 5.4 10/25/2022   HGBA1C 5.1 10/13/2021   HGBA1C 5.2 05/03/2020       Latest Ref Rng & Units 01/21/2024   10:21 AM 09/12/2023    8:19 PM 09/12/2023    6:25 PM  BMP  Glucose 70 - 99 mg/dL 86  82  93   BUN 6 - 20 mg/dL 11  11  12    Creatinine 0.44 - 1.00 mg/dL 9.11  9.19  9.18   Sodium 135 - 145 mmol/L 137  139  138   Potassium 3.5 - 5.1 mmol/L 3.4  4.6  3.5   Chloride 98 - 111 mmol/L 108  108  108   CO2 22 - 32 mmol/L 20   22   Calcium  8.9 - 10.3 mg/dL 8.8   8.9     Lab Results  Component Value Date   CHOL 200 (H) 10/25/2022   HDL 38 (L) 10/25/2022   LDLCALC 134 (H) 10/25/2022   TRIG 158 (H) 10/25/2022   CHOLHDL 5.3 (H) 10/25/2022    Medications Reviewed Today     Reviewed by Brinda Lorain SQUIBB, RPH (Pharmacist) on 04/09/24 at 1711  Med List Status: <None>   Medication Order Taking? Sig Documenting Provider Last Dose Status Informant  amitriptyline  (ELAVIL ) 10 MG tablet [id number] Yes Take 1 tablet (10 mg total) by mouth at bedtime. Charlanne Groom, MD  Active Self, Pharmacy Records  atomoxetine  (STRATTERA ) 18 MG capsule [id number] Yes Take 18 mg by mouth daily. [provider]  Active Self, Pharmacy Records           Med Note CARLEEN CHURCH D   Mon Jan 21, 2024 10:07 AM) Has at home - hasn't used in a while.  cetirizine  (ZYRTEC ) 10 MG tablet [id number]  Take 1 tablet (10 mg total) by mouth daily. Kozlow, Eric J, MD   Active Self, Pharmacy Records  cyclobenzaprine  (FLEXERIL ) 10 MG tablet [id number]  Take 1 tablet (10 mg total) by mouth 3 (three) times daily as needed for muscle spasms.  Patient not taking: Reported on 01/21/2024   Oley Bascom RAMAN, NP  Active Self, Pharmacy Records  FLUoxetine  (PROZAC ) 40 MG capsule [id number] Yes Take 40 mg by mouth daily. [provider]  Active Self, Pharmacy Records  fluticasone  (FLONASE ) 50 MCG/ACT nasal spray [id number] Yes 2 sprays per nostril 3-7 times per week. Kozlow, Eric J, MD  Active Self, Pharmacy Records  hydrOXYzine  (ATARAX ) 25 MG tablet [id number] Yes Take 25 mg by mouth daily at 6 (six) AM. Pt takes 1 table once a day. [provider]  Active Self, Pharmacy Records  hydrOXYzine  (ATARAX ) 50 MG tablet [id number] Yes Take 1 tablet (50 mg total) by mouth at bedtime. Joshua Bari HERO, NP  Active Self, Pharmacy Records  linaclotide  (LINZESS ) 145 MCG CAPS capsule [id number] Yes Take 1 capsule (145 mcg total) by mouth daily before breakfast. Charlanne Groom, MD  Active Self, Pharmacy Records  Olopatadine  HCl (PATADAY ) 0.2 % SOLN [id number]  Place 1 drop into both eyes daily as needed.  Patient taking differently: Place 1 drop into both eyes daily as needed (dry eyes).   Kozlow, Camellia PARAS, MD  Active Self, Pharmacy Records           Med Note CARLEEN GEORGIANN JONETTA Pablo Jan 21, 2024 10:05 AM) Has at home - hasn't used in a while.  pantoprazole  (PROTONIX ) 20 MG tablet [id number] Yes Take 1 tablet (20 mg total) by mouth daily. Charlanne Groom, MD  Active Self, Pharmacy Records  Prenatal Vit-Fe Fumarate-FA (PRENATAL VITAMIN PLUS LOW IRON ) 27-1 MG TABS [id number]  Take 1 tablet by mouth daily. Oley Bascom RAMAN, NP  Active Self, Pharmacy Records  propranolol  (INDERAL ) 40 MG tablet [id number] Yes TAKE 1 TABLET(40 MG) BY MOUTH TWICE DAILY Wyn Ernest H Jr., NP  Active Self, Pharmacy Records  Rimegepant Sulfate (NURTEC) 75 MG TBDP [id number] Yes Take 1 tablet (75 mg total) by  mouth as needed. Skeet Juliene SAUNDERS, DO  Active Self, Pharmacy Records   Patient not taking:   Discontinued 04/09/24 1711 (Patient has not taken in last 30 days) topiramate  (TOPAMAX ) 50 MG tablet [id number] Yes Take 2 tablets (100 mg total) by mouth at bedtime. Skeet Juliene SAUNDERS, DO  Active               Assessment/Plan:   Obesity/Overweight: - Patient is interested in starting medication for weight loss as she is currently unable to achieve goal weight loss of 5-10% through diet and lifestyle modifications alone. BMI > 45 kg/m2. Additionally, her weight is exacerbating her pain and chronic conditions. Would prefer for patient to access weight management services via the [facility] program, for a comprehensive approach to weight loss, given hx of disordered eating. However, she is not able to afford this service. Discussed her case with her PCP, Bascom Oley, NP and determined that since she is well connected with her psychiatrist and therapist, we can initiate her on a GLP-1RA with close monitoring.  - Extensive dietary counseling including education on focus on lean proteins, fruits and vegetables, whole grains and increased fiber consumption, adequate hydration - Extensive exercise counseling including eventual goal of 150 minutes of moderate intensity exercise weekly - Provided motivational interviewing. Discussed setting non-weight based goals. Emphasized importance of healthy habits regardless of weight loss.  - Provided extensive counseling on medication administration, storage, and side effects. Patient aware to let us  know if she feels this medication exacerbate any restrictive or purging eating behaviors. Will plan to provide patient with refills on a month-by-month basis - Discussed that our goal would be for her to be treated with this medication in a short-term period, then slowly titrate off once she has achieved a healthy weight.  - Patient confirms she is not planning to  become pregnant in the next year. - Recommend to start Wegovy  0.25 mg once weekly.  - Will collaborate with CPhT to submit PA.    Written patient instructions provided. Patient verbalized understanding of treatment plan.   Follow Up Plan:  Pharmacist 04/30/24 via telephone,  05/28/24 in-person  Lorain Baseman, PharmD [facility] [phone]

## 2024-04-10 ENCOUNTER — Telehealth: Payer: Self-pay

## 2024-04-10 ENCOUNTER — Other Ambulatory Visit: Payer: Self-pay

## 2024-04-10 NOTE — Telephone Encounter (Signed)
 Pharmacy Patient Advocate Encounter   Received notification from CoverMyMeds that prior authorization for WEGOVY  is required/requested.   Insurance verification completed.   The patient is insured through [employer] [place] .   Per test claim: PA required; PA submitted to above mentioned insurance via CoverMyMeds Key/confirmation #/EOC [facility] Status is pending

## 2024-04-11 ENCOUNTER — Other Ambulatory Visit: Payer: Self-pay

## 2024-04-11 ENCOUNTER — Telehealth: Payer: Self-pay

## 2024-04-11 NOTE — Telephone Encounter (Signed)
 Pharmacy Patient Advocate Encounter  Received notification from [employer] that Prior Authorization for WEGOVY  has been APPROVED from 04/10/2024 to 10/11/2024  [facility] has been notified and state they will call and test patient when ready. Copay $0

## 2024-04-11 NOTE — Progress Notes (Signed)
 NEUROLOGY FOLLOW UP OFFICE NOTE  Alexandra [name] [id number]  Assessment/Plan:   Migraine without aura, with status migrainosus, not intractable, suspect cervicogenic component Recurrent transient altered sensorium and shaking - psychogenic nonepileptic seizures Abnormal movements - unclear etiology.  Not consistent with seizures and not taking any offending medications.  Suspect functional   Migraine prevention:  Topiramate  100mg  at bedtime Migraine rescue:  Nurtec Limit use of pain relievers to no more than 9 days out of the month to prevent risk of rebound or medication-overuse headache. Keep headache diary Follow up 8 months.    Subjective:  Alexandra [name] is a 23 year old female with Ehlers-Danlos disease, POTS and anxiety who follows up for headaches.  UPDATE: Migraines: Intensity:  moderate Duration:  several minutes to an hour with Nurtec Frequency:  once a week  Seizure-like spells: 36 hour ambulatory EEG from 3/14-3/15/2025 was normal which captured 2 events, one habitual involving twitching, jerking and staring off, another involved uncontrollable laughing and crying, each without electrographic correlate indicating nonepileptic events.  She was admitted to the EMU at [facility] from 5/19-5/23/2025 in which she did have one event with left upper and lower extremity twitching which was nonepileptic.  She reports decreased jerking with topiramate .    She also reports some episodes of confusion where she blanks out.    Photosensitivity and phonophobia: She reports heightened sensitivity to light and noise.  She was uncomfortable on 7/4 due to the fireworks.     Current NSAIDS/analgesics:  none Current triptans:  none Current ergotamine:  none Current anti-emetic:  Zofran  4mg  Current muscle relaxants:  Flexeril  10mg  TID PRN Current Antihypertensive medications:  propranolol  40mg  BID Current Antidepressant medications:  amitriptyline  10mg   QHS (for abdominal spasms - higher doses caused dizziness), fluoxetine  40mg  daily Current Anticonvulsant medications:  topiramate  100mg  at bedtime Current anti-CGRP:  none Current Vitamins/Herbal/Supplements:  none Current Antihistamines/Decongestants:  none Other therapy:  none Birth control:  none Other medications:  Strattera , hydroxyzine    Caffeine :  avoids due to palpitations Diet:  drinks a lot of water Depression:  stable; Anxiety:  stable Other pain:  EDS, neck pain Sleep hygiene:  usually okay  HISTORY: Migraines: Onset:  Prior history of frequent headaches in 2022.  Subsequently resolved.  Restarted in March-April 2024.  Gradually getting worse Location:  Left occipital and then radiates forward and across forehead.  Quality:  squeezing, pounding Intensity:  3-9/10.  Aura:  absent Prodrome:  absent Associated symptoms:  Left eye blurred vision, nausea, vomiting.  She denies associated photophobia, phonophobia, unilateral numbness or weakness. Duration:  several hours (severe 1-3 hours) Frequency:  daily Frequency of abortive medication: Fioricet and cyclobenzaprine  daily for past week. Triggers:  unknown Relieving factors:  medication, ice, massager  Activity:  aggravates  Seizure-like activity: On 09/12/2023, she had a spell.  She develops a headache and blurred vision.  Her neck started twitching which spread to her eyes and then the right arm and leg with associated right arm and leg numbness.  She went in and out of consciousness, talks nonsensical and her heart was racing.  She developed on of her habitual migraine headaches with nausea and vomiting.  The shaking and altered sensorium lasted 15 minutes but headaches lasted around a couple of hours.  Seen in ED.  MRI of brain and cervical spine were unremarkable.  EEG was normal. She had subsequent similar episodes.  Prior to onset of these events, she reports she had other episodes of abnormal  spontaneous movements with  jerking or twitching of the eyes, neck and shoulder as well as lip smacking.  Previous doctors suggested they were nervous tics.  She is not on antipsychotics or other medications that would contribute to these symptoms.    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  Otherwise, brain looks normal.  Followed by neurosurgery.  Finding unlikely to be cause of headaches.  Due to increased frequency and severity of headaches 12/20/2022 MRI BRAIN W WO:  stable 7 mm pineal cyst but otherwise unremarkable.    Had a routine eye exam in early 2024 (prior to onset) which was fine  Past NSAIDS/analgesics:  ketorolac , ibuprofen , acetaminophen , Fioricet Past abortive triptans:  sumatriptan  tab (caused head burning) Past abortive ergotamine:  none Past muscle relaxants:  tizanidine  Past anti-emetic:  none Past antihypertensive medications:  metoprolol  Past antidepressant medications:  venlafaxine , imipramine , desipramine  Past anticonvulsant medications:  none Past anti-CGRP:  none Past vitamins/Herbal/Supplements:  magnesium  oxide Past antihistamines/decongestants:  Zyrtec , Benadryl  Other past therapies:  none   Family history of headache:  no  PAST MEDICAL HISTORY: Past Medical History:  Diagnosis Date   Anxiety    Dry skin    Eating disorder    Ehlers-Danlos disease    Fatty liver    POTS (postural orthostatic tachycardia syndrome)    Vision abnormalities     MEDICATIONS: Current Outpatient Medications on File Prior to Visit  Medication Sig Dispense Refill   amitriptyline  (ELAVIL ) 10 MG tablet Take 1 tablet (10 mg total) by mouth at bedtime. 30 tablet 6   atomoxetine  (STRATTERA ) 18 MG capsule Take 18 mg by mouth daily.     cetirizine  (ZYRTEC ) 10 MG tablet Take 1 tablet (10 mg total) by mouth daily. 30 tablet 3   cyclobenzaprine  (FLEXERIL ) 10 MG tablet Take 1 tablet (10 mg total) by mouth 3 (three) times daily as needed  for muscle spasms. (Patient not taking: Reported on 01/21/2024) 30 tablet 0   FLUoxetine  (PROZAC ) 40 MG capsule Take 40 mg by mouth daily.     fluticasone  (FLONASE ) 50 MCG/ACT nasal spray 2 sprays per nostril 3-7 times per week. 16 g 3   hydrOXYzine  (ATARAX ) 25 MG tablet Take 25 mg by mouth daily at 6 (six) AM. Pt takes 1 table once a day.     hydrOXYzine  (ATARAX ) 50 MG tablet Take 1 tablet (50 mg total) by mouth at bedtime. 30 tablet 0   linaclotide  (LINZESS ) 145 MCG CAPS capsule Take 1 capsule (145 mcg total) by mouth daily before breakfast. 30 capsule 6   Olopatadine  HCl (PATADAY ) 0.2 % SOLN Place 1 drop into both eyes daily as needed. (Patient taking differently: Place 1 drop into both eyes daily as needed (dry eyes).) 2.5 mL 3   pantoprazole  (PROTONIX ) 20 MG tablet Take 1 tablet (20 mg total) by mouth daily. 90 tablet 3   Prenatal Vit-Fe Fumarate-FA (PRENATAL VITAMIN PLUS LOW IRON ) 27-1 MG TABS Take 1 tablet by mouth daily. 30 tablet 2   propranolol  (INDERAL ) 40 MG tablet TAKE 1 TABLET(40 MG) BY MOUTH TWICE DAILY 180 tablet 2   Rimegepant Sulfate (NURTEC) 75 MG TBDP Take 1 tablet (75 mg total) by mouth as needed. 16 tablet 5   semaglutide -weight management (WEGOVY ) 0.25 MG/0.5ML SOAJ SQ injection Inject 0.25 mg into the skin once a week. 2 mL 0   topiramate  (TOPAMAX ) 50 MG tablet Take 2 tablets (100 mg total) by mouth at  bedtime. 60 tablet 5   No current facility-administered medications on file prior to visit.    ALLERGIES: Allergies  Allergen Reactions   Bee Pollen Itching and Cough    seasonal allergies   Methylprednisolone  Anxiety and Other (See Comments)    Became angry and had a weird feeling   Pollen Extract Hives, Itching and Cough    seasonal allergies    FAMILY HISTORY: Family History  Problem Relation Age of Onset   Hyperthyroidism Mother    Diabetes Mother    Asthma Father    Allergic rhinitis Sister    Asthma Sister    Cataracts Sister    Strabismus Sister     Allergic rhinitis Brother    Non-Hodgkin's lymphoma Brother    Cancer Brother    Asthma Maternal Uncle    Colon cancer Neg Hx    Stomach cancer Neg Hx    Esophageal cancer Neg Hx    Colon polyps Neg Hx       Objective:  Blood pressure 109/73, pulse 80, height 5' 4 (1.626 m), weight 278 lb (126.1 kg), SpO2 98%. General: No acute distress.  Patient appears well-groomed.     Juliene Dunnings, DO  CC: Bascom Borer, NP

## 2024-04-14 ENCOUNTER — Encounter: Payer: Self-pay | Admitting: Neurology

## 2024-04-14 ENCOUNTER — Ambulatory Visit (INDEPENDENT_AMBULATORY_CARE_PROVIDER_SITE_OTHER): Payer: MEDICAID | Admitting: Neurology

## 2024-04-14 VITALS — BP 109/73 | HR 80 | Ht 64.0 in | Wt 278.0 lb

## 2024-04-14 DIAGNOSIS — G43001 Migraine without aura, not intractable, with status migrainosus: Secondary | ICD-10-CM

## 2024-04-14 DIAGNOSIS — F445 Conversion disorder with seizures or convulsions: Secondary | ICD-10-CM | POA: Diagnosis not present

## 2024-04-14 MED ORDER — NURTEC 75 MG PO TBDP: 75.0000 mg | ORAL_TABLET | ORAL | 5 refills | Status: AC | PRN

## 2024-04-14 NOTE — Patient Instructions (Signed)
 Topiramate  100mg  at bedtime Nurtec once daily as needed for headaches Limit use of pain relievers to no more than 9 days out of the month to prevent risk of rebound or medication-overuse headache. Keep headache diary

## 2024-04-15 NOTE — Telephone Encounter (Signed)
Called patient, left voice message

## 2024-04-21 ENCOUNTER — Telehealth: Payer: Self-pay

## 2024-04-21 NOTE — Telephone Encounter (Signed)
 LM

## 2024-04-26 ENCOUNTER — Other Ambulatory Visit: Payer: Self-pay | Admitting: Nurse Practitioner

## 2024-04-28 ENCOUNTER — Other Ambulatory Visit: Payer: Self-pay | Admitting: Nurse Practitioner

## 2024-04-29 ENCOUNTER — Other Ambulatory Visit (HOSPITAL_COMMUNITY): Payer: Self-pay

## 2024-04-29 ENCOUNTER — Other Ambulatory Visit (INDEPENDENT_AMBULATORY_CARE_PROVIDER_SITE_OTHER): Payer: MEDICAID

## 2024-04-29 MED ORDER — WEGOVY 0.25 MG/0.5ML ~~LOC~~ SOAJ: 0.2500 mg | SUBCUTANEOUS | 0 refills | Status: DC

## 2024-04-29 NOTE — Progress Notes (Cosign Needed Addendum)
 04/29/2024 Name: Alexandra [name] MRN: [id number] DOB: 04-02-2001  Chief Complaint  Patient presents with   Weight Management Screening    Alexandra [name] is a 23 y.o. year old female who presented for a telephone visit.   They were referred to the pharmacist by their PCP for assistance in managing weight management.  PMH includes POTS, GERD, Ehlers-Danlos syndrome, anorexia nervosa with bulimia (2018), MDD w/ hx of suicidal ideation, ADHD, PTSD, HLD, BMI > 30.    Subjective: Patient was last seen by PCP, Bascom Borer, NP, on 01/18/24. At last visit, patient reported acute low back pain. She also self-reported seizure like activity. She was admitted on 01/21/24 for an EEG without abnormalities. Patient was initially engaged by pharmacy via telephone on 04/09/24. She reported that she  initially talked with Tonya about starting a weight loss medication back in April. She had a referral to [facility], but she had to cancel the appt because she wasn't able to pay the $90 entrance fee. She reported that recently she increased physical activity. She also was trying to implement portion control. Reported that she had symptoms c/w binge eating in the past, but reports that she has not been engaging with this behavior recently. Despite these lifestyle changes, she reports she is not seeing changes in her weight. She reported that she is well connected with psychiatry who she sees every 2 months, and a therapist who she sees every 2 weeks. Her mother is also an important part of her support system and monitors her eating behaviors to ensure she doesn't relapse. She reported that she has talked with her psychiatrist about her frustrations with inability to lose weight. Her psychiatrist suggested that she may be a candidate for an injectable weight loss medication. She had also expressed this desire to her counselor. She also reported that she feels she has more pain due to her weight, and cannot exercise  as much as she would want. After discussion with her PCP, patient was initiated on Wegovy  0.25 mg weekly with close follow-up.   Today, patient reports doing well. Reports she is due for her next dose of Wegovy  on Friday 05/02/24 and has run out early, because the first pen failed.   Care Team: Primary Care Provider: Borer Bascom RAMAN, NP ; Next Scheduled Visit: Needs to be scheduled Psychiatrist: Dr. Shirline: typically sees every 2 months Therapist: Sees every 2 weeks  Medication Access/Adherence  Current Pharmacy:  [id number] [address], [place] - [address] AT [facility] & [address] [place] [address] Phone: [phone] Fax: [phone]  [facility] [address] [place] [address] Phone: [phone] Fax: [phone]   Patient reports affordability concerns with their medications: Yes  - unable to afford [facility] program Patient reports access/transportation concerns to their pharmacy: No  Patient reports adherence concerns with their medications:  No    Obesity/Overweight:   Patient reports that she had anorexia when she was young. As she came out of this condition, she began to struggle with binge eating. Feels that through counseling, she is more self aware. Reports that her mother helps her monitor for any signs of these conditions recurring. She has discussed the possibility of starting a weight loss medication with her mother, and while she was initially hesitant, she would like for her to have the benefits of possibly increased mobility and decreased pain. Reports that she feels  her Ehlers-Danlos diagnosis is worsened by her weight as well.   Current medications: Wegovy  0.25 mg weekly (Fridays)  Reports that she has felt a little nauseous and had a little more frequent HA since starting the medication. She also notices some fluctuations in her hunger - describes hunger as an on/off  switch. Reports that she is still able to eat regularly throughout the day.  Weight Management treatments previously prescribed: none  Current meal patterns: Currently eating 2-3 meals per day. She eats with her family. Reports that she has been trying to increase her protein intake recently.  - Drinks: Reports that she is able to stay hydrated with water throughout the day. Drinks > 64 oz water per day. She has cut out juice and soda and has been drinking flavored water drinks instead.  Current physical activity: Has been going to the gym with her sister and working out at home. Reports she is limited by pain - recently had a knee injury, which she feels is exacerbated by her weight  Current medication access support: Diabetes in her mother, Diabetes in her grandmother  Objective:  BMI Readings from Last 3 Encounters:  04/14/24 47.72 kg/m  01/21/24 47.89 kg/m  01/18/24 47.96 kg/m   Wt Readings from Last 3 Encounters:  04/14/24 278 lb (126.1 kg)  01/21/24 279 lb (126.6 kg)  01/18/24 279 lb 6.4 oz (126.7 kg)    BP Readings from Last 3 Encounters:  04/14/24 109/73  01/25/24 (!) 110/54  01/18/24 112/64    Lab Results  Component Value Date   HGBA1C 5.4 10/25/2022   HGBA1C 5.1 10/13/2021   HGBA1C 5.2 05/03/2020       Latest Ref Rng & Units 01/21/2024   10:21 AM 09/12/2023    8:19 PM 09/12/2023    6:25 PM  BMP  Glucose 70 - 99 mg/dL 86  82  93   BUN 6 - 20 mg/dL 11  11  12    Creatinine 0.44 - 1.00 mg/dL 9.11  9.19  9.18   Sodium 135 - 145 mmol/L 137  139  138   Potassium 3.5 - 5.1 mmol/L 3.4  4.6  3.5   Chloride 98 - 111 mmol/L 108  108  108   CO2 22 - 32 mmol/L 20   22   Calcium  8.9 - 10.3 mg/dL 8.8   8.9     Lab Results  Component Value Date   CHOL 200 (H) 10/25/2022   HDL 38 (L) 10/25/2022   LDLCALC 134 (H) 10/25/2022   TRIG 158 (H) 10/25/2022   CHOLHDL 5.3 (H) 10/25/2022    Medications Reviewed Today     Reviewed by Brinda Lorain SQUIBB, RPH (Pharmacist) on  04/29/24 at 1807  Med List Status: <None>   Medication Order Taking? Sig Documenting Provider Last Dose Status Informant  amitriptyline  (ELAVIL ) 10 MG tablet [id number]  Take 1 tablet (10 mg total) by mouth at bedtime. Charlanne Groom, MD  Active Self, Pharmacy Records  atomoxetine  (STRATTERA ) 18 MG capsule [id number]  Take 18 mg by mouth daily. [provider]  Active Self, Pharmacy Records           Med Note CARLEEN CHURCH D   Mon Jan 21, 2024 10:07 AM) Has at home - hasn't used in a while.  cetirizine  (ZYRTEC ) 10 MG tablet [id number]  Take 1 tablet (10 mg total) by mouth daily. Kozlow, Eric J, MD  Active Self, Pharmacy Records  cyclobenzaprine  (FLEXERIL ) 10 MG tablet [id number]  Take 1  tablet (10 mg total) by mouth 3 (three) times daily as needed for muscle spasms. Oley Bascom RAMAN, NP  Active Self, Pharmacy Records  FLUoxetine  (PROZAC ) 40 MG capsule [id number]  Take 40 mg by mouth daily. [provider]  Active Self, Pharmacy Records  fluticasone  (FLONASE ) 50 MCG/ACT nasal spray [id number]  2 sprays per nostril 3-7 times per week. Kozlow, Eric J, MD  Active Self, Pharmacy Records  hydrOXYzine  (ATARAX ) 25 MG tablet [id number]  Take 25 mg by mouth daily at 6 (six) AM. Pt takes 1 table once a day. [provider]  Active Self, Pharmacy Records  hydrOXYzine  (ATARAX ) 50 MG tablet [id number]  Take 1 tablet (50 mg total) by mouth at bedtime. Joshua Bari HERO, NP  Active Self, Pharmacy Records  linaclotide  (LINZESS ) 145 MCG CAPS capsule [id number]  Take 1 capsule (145 mcg total) by mouth daily before breakfast. Charlanne Groom, MD  Active Self, Pharmacy Records  Olopatadine  HCl (PATADAY ) 0.2 % SOLN [id number]  Place 1 drop into both eyes daily as needed. Kozlow, Camellia PARAS, MD  Active Self, Pharmacy Records           Med Note CARLEEN GEORGIANN JONETTA Pablo Jan 21, 2024 10:05 AM) Has at home - hasn't used in a while.  pantoprazole  (PROTONIX ) 20 MG tablet [id number]  Take 1 tablet (20 mg  total) by mouth daily. Charlanne Groom, MD  Active Self, Pharmacy Records  Prenatal Vit-Fe Fumarate-FA (PRENATAL VITAMIN PLUS LOW IRON ) 27-1 MG TABS [id number]  Take 1 tablet by mouth daily. Oley Bascom RAMAN, NP  Active Self, Pharmacy Records  propranolol  (INDERAL ) 40 MG tablet [id number]  TAKE 1 TABLET(40 MG) BY MOUTH TWICE DAILY Wyn Ernest H Jr., NP  Active Self, Pharmacy Records  Rimegepant Sulfate (NURTEC) 75 MG TBDP [id number]  Take 1 tablet (75 mg total) by mouth as needed. Skeet Juliene SAUNDERS, DO  Active   semaglutide -weight management (WEGOVY ) 0.25 MG/0.5ML SOAJ SQ injection [id number] Yes Inject 0.25 mg into the skin once a week. Oley Bascom RAMAN, NP  Active   topiramate  (TOPAMAX ) 50 MG tablet [id number]  Take 2 tablets (100 mg total) by mouth at bedtime. Skeet Juliene SAUNDERS, DO  Active               Assessment/Plan:   Obesity/Overweight: - Patient was initiated on medication for weight loss as she was currently unable to achieve goal weight loss of 5-10% through diet and lifestyle modifications alone. Baseline BMI > 45 kg/m2. Additionally, her weight is exacerbating her pain and chronic conditions. Would prefer for patient to access weight management services via the [facility] program, for a comprehensive approach to weight loss, given hx of disordered eating. However, she is not able to afford this service. She is tolerating Wegovy  0.25 mg weekly well, but given some continued appetite suppression and nausea, would recommend continuing starting dose for another month.  - Extensive dietary counseling including education on focus on lean proteins, fruits and vegetables, whole grains and increased fiber consumption, adequate hydration - Extensive exercise counseling including eventual goal of 150 minutes of moderate intensity exercise weekly - Provided motivational interviewing. Discussed setting non-weight based goals. Emphasized importance of healthy habits regardless of weight loss.   - Provided extensive counseling on medication administration, storage, and side effects. Patient aware to let us  know if she feels this medication exacerbate any restrictive or purging eating behaviors. Will plan to provide patient with refills on a month-by-month basis - Discussed that our  goal would be for her to be treated with this medication in a short-term period, then slowly titrate off once she has achieved a healthy weight.  - Patient confirms she is not planning to become pregnant in the next year and is using an effective method of birth control. - Recommend to continue Wegovy  0.25 mg once weekly.   Patient verbalized understanding of treatment plan.   Follow Up Plan:  Pharmacist 05/28/24 in-person, then will schedule PCP f/u  Lorain Baseman, PharmD [facility] [phone]

## 2024-04-30 ENCOUNTER — Other Ambulatory Visit: Payer: Self-pay

## 2024-05-13 ENCOUNTER — Telehealth: Payer: Self-pay

## 2024-05-13 NOTE — Telephone Encounter (Signed)
 Completed.

## 2024-05-22 ENCOUNTER — Encounter: Payer: Self-pay | Admitting: Advanced Practice Midwife

## 2024-05-23 ENCOUNTER — Other Ambulatory Visit: Payer: Self-pay | Admitting: Nurse Practitioner

## 2024-05-23 MED ORDER — ONDANSETRON 4 MG PO TBDP: 4.0000 mg | ORAL_TABLET | Freq: Three times a day (TID) | ORAL | 0 refills | Status: DC | PRN

## 2024-05-26 ENCOUNTER — Encounter (HOSPITAL_BASED_OUTPATIENT_CLINIC_OR_DEPARTMENT_OTHER): Payer: Self-pay

## 2024-05-28 ENCOUNTER — Ambulatory Visit (INDEPENDENT_AMBULATORY_CARE_PROVIDER_SITE_OTHER): Payer: MEDICAID

## 2024-05-28 ENCOUNTER — Other Ambulatory Visit (HOSPITAL_COMMUNITY): Payer: Self-pay

## 2024-05-28 MED ORDER — WEGOVY 0.5 MG/0.5ML ~~LOC~~ SOAJ: 0.5000 mg | SUBCUTANEOUS | 1 refills | Status: DC
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
## 2024-05-28 NOTE — Patient Instructions (Signed)
 It was great to see you today.   You can pick up Wegovy  0.5 mg at your next fill from the pharmacy.   Please let me know if you have any questions or concerns. You are doing a great job!  Lorain Baseman, PharmD [facility] [phone]

## 2024-05-29 ENCOUNTER — Encounter: Payer: Self-pay | Admitting: Nurse Practitioner

## 2024-06-03 ENCOUNTER — Encounter: Payer: Self-pay | Admitting: Advanced Practice Midwife

## 2024-06-17 ENCOUNTER — Encounter: Payer: Self-pay | Admitting: Advanced Practice Midwife

## 2024-06-17 ENCOUNTER — Ambulatory Visit (INDEPENDENT_AMBULATORY_CARE_PROVIDER_SITE_OTHER): Payer: MEDICAID | Admitting: Advanced Practice Midwife

## 2024-06-17 VITALS — BP 105/70 | HR 73 | Ht 64.0 in | Wt 269.2 lb

## 2024-06-17 DIAGNOSIS — Z3046 Encounter for surveillance of implantable subdermal contraceptive: Secondary | ICD-10-CM

## 2024-06-17 DIAGNOSIS — N942 Vaginismus: Secondary | ICD-10-CM

## 2024-06-17 DIAGNOSIS — Z3009 Encounter for other general counseling and advice on contraception: Secondary | ICD-10-CM

## 2024-06-17 DIAGNOSIS — Z01419 Encounter for gynecological examination (general) (routine) without abnormal findings: Secondary | ICD-10-CM | POA: Diagnosis not present

## 2024-06-17 MED ORDER — NORELGESTROMIN-ETH ESTRADIOL 150-35 MCG/24HR TD PTWK: MEDICATED_PATCH | TRANSDERMAL | 4 refills | Status: AC

## 2024-06-17 NOTE — Progress Notes (Signed)
 Pt c/o pain at Nexplanon  site.  Nexplanon  placed 01/2024. Pt also c/o irregular periods,  vaginal and abd pain and cramping she feels is caused by Nexplanon . Requesting removal today. Declines new BC
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
## 2024-06-18 ENCOUNTER — Other Ambulatory Visit (HOSPITAL_COMMUNITY)
Admission: RE | Admit: 2024-06-18 | Discharge: 2024-06-18 | Disposition: A | Discharge status: Home / Self Care | Payer: MEDICAID | Source: Ambulatory Visit | Attending: Obstetrics and Gynecology | Admitting: Obstetrics and Gynecology

## 2024-06-18 DIAGNOSIS — Z01419 Encounter for gynecological examination (general) (routine) without abnormal findings: Secondary | ICD-10-CM | POA: Insufficient documentation

## 2024-06-18 NOTE — Addendum Note (Signed)
 Addended by: LANG RIGGS A on: 06/18/2024 08:47 AM   Modules accepted: Orders

## 2024-06-19 LAB — CYTOLOGY - PAP: Diagnosis: NEGATIVE

## 2024-06-19 LAB — CERVICOVAGINAL ANCILLARY ONLY
Bacterial Vaginitis (gardnerella): NEGATIVE
Candida Glabrata: NEGATIVE
Candida Vaginitis: NEGATIVE
Chlamydia: NEGATIVE
Comment: NEGATIVE
Comment: NEGATIVE
Comment: NEGATIVE
Comment: NEGATIVE
Comment: NEGATIVE
Comment: NORMAL
Neisseria Gonorrhea: NEGATIVE
Trichomonas: NEGATIVE

## 2024-06-27 ENCOUNTER — Other Ambulatory Visit: Payer: Self-pay | Admitting: Nurse Practitioner

## 2024-06-27 MED ORDER — ONDANSETRON 4 MG PO TBDP: 4.0000 mg | ORAL_TABLET | Freq: Three times a day (TID) | ORAL | 0 refills | Status: AC | PRN

## 2024-07-09 ENCOUNTER — Other Ambulatory Visit: Payer: Self-pay

## 2024-07-16 ENCOUNTER — Other Ambulatory Visit (INDEPENDENT_AMBULATORY_CARE_PROVIDER_SITE_OTHER): Payer: MEDICAID

## 2024-07-16 NOTE — Progress Notes (Signed)
 07/16/2024 Name: Alexandra [name] MRN: [id number] DOB: [birth year]  Chief Complaint  Patient presents with   Weight Management Screening    Alexandra [name] is a 23 y.o. year old female who presented for a telephone visit.   They were referred to the pharmacist by their PCP for assistance in managing weight management.  PMH includes POTS, GERD, Ehlers-Danlos syndrome, anorexia nervosa with bulimia (2018), MDD w/ hx of suicidal ideation, ADHD, PTSD, HLD, BMI > 30.    Subjective: Patient was last seen by PCP, Bascom Borer, NP, on 01/18/24. At last visit, patient reported acute low back pain. She also self-reported seizure like activity. She was admitted on 01/21/24 for an EEG without abnormalities. Patient was initially engaged by pharmacy via telephone on 04/09/24. She reported that she  initially talked with Tonya about starting a weight loss medication back in April. She had a referral to [facility], but she had to cancel the appt because she wasn't able to pay the $90 entrance fee. She reported that recently she increased physical activity. She also was trying to implement portion control. Reported that she had symptoms c/w binge eating in the past, but reports that she has not been engaging with this behavior recently. Despite these lifestyle changes, she reports she is not seeing changes in her weight. She reported that she is well connected with psychiatry who she sees every 2 months, and a therapist who she sees every 2 weeks. Her mother is also an important part of her support system and monitors her eating behaviors to ensure she doesn't relapse. She reported that she has talked with her psychiatrist about her frustrations with inability to lose weight. Her psychiatrist suggested that she may be a candidate for an injectable weight loss medication. She had also expressed this desire to her counselor. She also reported that she feels she has more pain due to her weight, and cannot  exercise as much as she would want. After discussion with her PCP, patient was initiated on Wegovy  0.25 mg weekly with close follow-up. At pharmacy follow-up via telephone on 04/29/24, patient reported doing well. She was continued on Wegovy  0.25 mg since she was having some nausea and appetite suppression. At pharmacy visit on 05/28/24, we discussed that Wegovy  would no longer be covered by insurance after her next fill. Continued to encourage healthy diet and lifestyle choices.   Today, patient reports doing ok. No issues or major changes in appetite or diet as she has come off Wegovy . Has continued efforts towards healthy eating. Would like to continue working on drinking more water and increasing exercise.   Care Team: Primary Care Provider: Borer Bascom RAMAN, NP ; Next Scheduled Visit: yearly exam due May 2026 Psychiatrist: Dr. Shirline: typically sees every 2 months Therapist: Sees every 2 weeks  Medication Access/Adherence  Current Pharmacy:  [id number] [place], [place] - [address] AT [facility] OF [facility] & [address] [place] [address] Phone: [phone] Fax: [phone]  [facility] [address] [place] [address] Phone: [phone] Fax: [phone]   Patient reports affordability concerns with their medications: Yes  - unable to afford [facility] program Patient reports access/transportation concerns to their pharmacy: No  Patient reports adherence concerns with their medications:  No    Obesity/Overweight:   Current medications: none  Weight Management treatments previously prescribed: none, she takes topiramate  100 mg nightly prescribed by psych  Current  meal patterns: Currently eating 2-3 meals per day. She eats with her family. Reports that she has been trying to increase her protein intake recently.  - Drinks: Reports that she is able to stay hydrated with water throughout the day. Drinks >  64 oz water per day. She has cut out juice and soda and has been drinking flavored water drinks instead.  Current physical activity: Previously going to the gym with her sister and working out at home (just dance for 1-2 hours at a time). Less time for exercise recently with school and time change.  Current medication access support: Diabetes in her mother, Diabetes in her grandmother  Objective:  BMI Readings from Last 3 Encounters:  06/17/24 46.21 kg/m  04/14/24 47.72 kg/m  01/21/24 47.89 kg/m   Wt Readings from Last 3 Encounters:  06/17/24 269 lb 3.2 oz (122.1 kg)  04/14/24 278 lb (126.1 kg)  01/21/24 279 lb (126.6 kg)    BP Readings from Last 3 Encounters:  06/17/24 105/70  04/14/24 109/73  01/25/24 (!) 110/54    Lab Results  Component Value Date   HGBA1C 5.4 10/25/2022   HGBA1C 5.1 10/13/2021   HGBA1C 5.2 05/03/2020       Latest Ref Rng & Units 01/21/2024   10:21 AM 09/12/2023    8:19 PM 09/12/2023    6:25 PM  BMP  Glucose 70 - 99 mg/dL 86  82  93   BUN 6 - 20 mg/dL 11  11  12    Creatinine 0.44 - 1.00 mg/dL 9.11  9.19  9.18   Sodium 135 - 145 mmol/L 137  139  138   Potassium 3.5 - 5.1 mmol/L 3.4  4.6  3.5   Chloride 98 - 111 mmol/L 108  108  108   CO2 22 - 32 mmol/L 20   22   Calcium  8.9 - 10.3 mg/dL 8.8   8.9     Lab Results  Component Value Date   CHOL 200 (H) 10/25/2022   HDL 38 (L) 10/25/2022   LDLCALC 134 (H) 10/25/2022   TRIG 158 (H) 10/25/2022   CHOLHDL 5.3 (H) 10/25/2022    Medications Reviewed Today     Reviewed by Brinda Lorain SQUIBB, RPH (Pharmacist) on 07/16/24 at 1641  Med List Status: <None>   Medication Order Taking? Sig Documenting Provider Last Dose Status Informant  amitriptyline  (ELAVIL ) 10 MG tablet [id number]  Take 1 tablet (10 mg total) by mouth at bedtime. Charlanne Groom, MD  Active Self, Pharmacy Records  atomoxetine  (STRATTERA ) 18 MG capsule [id number]  Take 18 mg by mouth daily. [provider]  Active Self, Pharmacy Records            Med Note CARLEEN CHURCH D   Mon Jan 21, 2024 10:07 AM) Has at home - hasn't used in a while.  cetirizine  (ZYRTEC ) 10 MG tablet [id number]  Take 1 tablet (10 mg total) by mouth daily. Kozlow, Eric J, MD  Active Self, Pharmacy Records  FLUoxetine  (PROZAC ) 40 MG capsule [id number]  Take 40 mg by mouth daily. [provider]  Active Self, Pharmacy Records  fluticasone  (FLONASE ) 50 MCG/ACT nasal spray [id number]  2 sprays per nostril 3-7 times per week. Kozlow, Eric J, MD  Active Self, Pharmacy Records  hydrOXYzine  (ATARAX ) 25 MG tablet [id number]  Take 25 mg by mouth daily at 6 (six) AM. Pt takes 1 table once a day. [provider]  Active Self, Pharmacy Records  hydrOXYzine  (ATARAX ) 50 MG tablet [id number]  Take 1 tablet (50 mg total) by mouth at bedtime. Joshua Bari HERO, NP  Active Self, Pharmacy Records  linaclotide  (LINZESS ) 145 MCG CAPS capsule [id number]  Take 1 capsule (145 mcg total) by mouth daily before breakfast. Charlanne Groom, MD  Active Self, Pharmacy Records  norelgestromin-ethinyl estradiol (XULANE) 150-35 MCG/24HR transdermal patch [id number]  Place one patch on the skin once per week for 3 weeks, then remove patch for 1 week.  Repeat. Leftwich-Kirby, Olam LABOR, CNM  Active   Olopatadine  HCl (PATADAY ) 0.2 % SOLN [id number]  Place 1 drop into both eyes daily as needed. Kozlow, Camellia PARAS, MD  Active Self, Pharmacy Records           Med Note CARLEEN GEORGIANN JONETTA Pablo Jan 21, 2024 10:05 AM) Has at home - hasn't used in a while.  ondansetron  (ZOFRAN -ODT) 4 MG disintegrating tablet [id number]  Take 1 tablet (4 mg total) by mouth every 8 (eight) hours as needed. Oley Bascom RAMAN, NP  Active   pantoprazole  (PROTONIX ) 20 MG tablet [id number]  Take 1 tablet (20 mg total) by mouth daily. Charlanne Groom, MD  Active Self, Pharmacy Records  Prenatal Vit-Fe Fumarate-FA (PRENATAL VITAMIN PLUS LOW IRON ) 27-1 MG TABS [id number]  Take 1 tablet by mouth daily. Oley Bascom RAMAN, NP  Active  Self, Pharmacy Records  propranolol  (INDERAL ) 40 MG tablet [id number]  TAKE 1 TABLET(40 MG) BY MOUTH TWICE DAILY Wyn Ernest H Jr., NP  Active Self, Pharmacy Records  Rimegepant Sulfate (NURTEC) 75 MG TBDP [id number]  Take 1 tablet (75 mg total) by mouth as needed. Skeet Juliene SAUNDERS, DO  Active     Discontinued 07/16/24 1641 (Not covered by the pt's insurance)   topiramate  (TOPAMAX ) 50 MG tablet [id number]  Take 2 tablets (100 mg total) by mouth at bedtime. Skeet Juliene SAUNDERS, DO  Active               Assessment/Plan:   Obesity/Overweight: - Patient doing well after discontinuing Wegovy  due to changes in insurance - Extensive dietary counseling including education on focus on lean proteins, fruits and vegetables, whole grains and increased fiber consumption, adequate hydration - Extensive exercise counseling including eventual goal of 150 minutes of moderate intensity exercise weekly - Provided motivational interviewing. Discussed setting non-weight based goals. Emphasized importance of healthy habits regardless of weight loss.   Patient verbalized understanding of treatment plan.   Follow Up Plan:  Pharmacist as needed, PCP (yearly)  Lorain Baseman, PharmD [facility] [phone]

## 2024-08-04 ENCOUNTER — Other Ambulatory Visit: Payer: Self-pay | Admitting: Nurse Practitioner

## 2024-10-09 ENCOUNTER — Other Ambulatory Visit: Payer: Self-pay | Admitting: Nurse Practitioner

## 2024-10-09 MED ORDER — LIDOCAINE 5 % EX PTCH: 1.0000 | MEDICATED_PATCH | CUTANEOUS | 0 refills | Status: AC

## 2024-12-15 ENCOUNTER — Ambulatory Visit: Payer: MEDICAID | Admitting: Neurology
